# Patient Record
Sex: Female | Born: 1945 | Race: Black or African American | Hispanic: No | Marital: Single | Smoking: Former smoker
Health system: Southern US, Community
[De-identification: ages and names within clinical notes are randomized; demographics above are authoritative.]

## PROBLEM LIST (undated history)

## (undated) DIAGNOSIS — E119 Type 2 diabetes mellitus without complications: Secondary | ICD-10-CM

## (undated) DIAGNOSIS — I10 Essential (primary) hypertension: Secondary | ICD-10-CM

## (undated) DIAGNOSIS — I639 Cerebral infarction, unspecified: Secondary | ICD-10-CM

---

## 2016-01-11 DIAGNOSIS — K7030 Alcoholic cirrhosis of liver without ascites: Secondary | ICD-10-CM | POA: Diagnosis present

## 2016-01-11 DIAGNOSIS — I69359 Hemiplegia and hemiparesis following cerebral infarction affecting unspecified side: Secondary | ICD-10-CM

## 2016-10-24 DIAGNOSIS — Z8619 Personal history of other infectious and parasitic diseases: Secondary | ICD-10-CM | POA: Diagnosis present

## 2017-03-13 ENCOUNTER — Other Ambulatory Visit: Payer: Self-pay | Admitting: Family Medicine

## 2017-03-13 DIAGNOSIS — E119 Type 2 diabetes mellitus without complications: Secondary | ICD-10-CM

## 2017-03-13 DIAGNOSIS — I639 Cerebral infarction, unspecified: Secondary | ICD-10-CM

## 2017-03-13 DIAGNOSIS — R200 Anesthesia of skin: Secondary | ICD-10-CM

## 2017-03-14 ENCOUNTER — Ambulatory Visit: Payer: Self-pay

## 2017-04-29 DIAGNOSIS — N390 Urinary tract infection, site not specified: Secondary | ICD-10-CM | POA: Insufficient documentation

## 2018-09-14 DIAGNOSIS — I10 Essential (primary) hypertension: Secondary | ICD-10-CM | POA: Diagnosis present

## 2018-11-06 ENCOUNTER — Encounter: Payer: Self-pay | Admitting: Emergency Medicine

## 2018-11-06 ENCOUNTER — Other Ambulatory Visit: Payer: Self-pay

## 2018-11-06 DIAGNOSIS — E11649 Type 2 diabetes mellitus with hypoglycemia without coma: Secondary | ICD-10-CM | POA: Diagnosis present

## 2018-11-06 DIAGNOSIS — I10 Essential (primary) hypertension: Secondary | ICD-10-CM | POA: Insufficient documentation

## 2018-11-06 DIAGNOSIS — T383X1A Poisoning by insulin and oral hypoglycemic [antidiabetic] drugs, accidental (unintentional), initial encounter: Secondary | ICD-10-CM | POA: Insufficient documentation

## 2018-11-06 DIAGNOSIS — Z79899 Other long term (current) drug therapy: Secondary | ICD-10-CM | POA: Diagnosis not present

## 2018-11-06 DIAGNOSIS — Z7984 Long term (current) use of oral hypoglycemic drugs: Secondary | ICD-10-CM | POA: Diagnosis not present

## 2018-11-06 NOTE — ED Triage Notes (Signed)
Pt arrives accompanied with daughter, reports pt's sister administered night medication accidentally reports pt already took night medications, reports has been having n/v and CBG dropped.

## 2018-11-06 NOTE — ED Triage Notes (Signed)
Pt arrives accompanied by daughter, reports accidentaly tonight her sister admisnitered her night medications was not aware pt already took them glipizide-metformin, benadryl, olanpazine, vimpat..CBG at home 90 took glucose tablets prior to arrival to ER. Pt hx of stroke left side deficit.

## 2018-11-07 ENCOUNTER — Emergency Department
Admission: EM | Admit: 2018-11-07 | Discharge: 2018-11-07 | Disposition: A | Discharge status: Home / Self Care | Payer: 59 | Attending: Emergency Medicine | Admitting: Emergency Medicine

## 2018-11-07 DIAGNOSIS — T50901A Poisoning by unspecified drugs, medicaments and biological substances, accidental (unintentional), initial encounter: Secondary | ICD-10-CM

## 2018-11-07 DIAGNOSIS — T383X1A Poisoning by insulin and oral hypoglycemic [antidiabetic] drugs, accidental (unintentional), initial encounter: Secondary | ICD-10-CM | POA: Diagnosis not present

## 2018-11-07 HISTORY — DX: Essential (primary) hypertension: I10

## 2018-11-07 HISTORY — DX: Type 2 diabetes mellitus without complications: E11.9

## 2018-11-07 HISTORY — DX: Cerebral infarction, unspecified: I63.9

## 2018-11-07 LAB — CBC WITH DIFFERENTIAL/PLATELET
Abs Immature Granulocytes: 0.02 10*3/uL (ref 0.00–0.07)
Basophils Absolute: 0.1 10*3/uL (ref 0.0–0.1)
Basophils Relative: 1 %
Eosinophils Absolute: 0.2 10*3/uL (ref 0.0–0.5)
Eosinophils Relative: 2 %
HCT: 41.5 % (ref 36.0–46.0)
Hemoglobin: 13.6 g/dL (ref 12.0–15.0)
Immature Granulocytes: 0 %
Lymphocytes Relative: 49 %
Lymphs Abs: 4.6 10*3/uL — ABNORMAL HIGH (ref 0.7–4.0)
MCH: 30.4 pg (ref 26.0–34.0)
MCHC: 32.8 g/dL (ref 30.0–36.0)
MCV: 92.8 fL (ref 80.0–100.0)
Monocytes Absolute: 0.5 10*3/uL (ref 0.1–1.0)
Monocytes Relative: 5 %
Neutro Abs: 4 10*3/uL (ref 1.7–7.7)
Neutrophils Relative %: 43 %
Platelets: 193 10*3/uL (ref 150–400)
RBC: 4.47 MIL/uL (ref 3.87–5.11)
RDW: 13.2 % (ref 11.5–15.5)
WBC: 9.4 10*3/uL (ref 4.0–10.5)
nRBC: 0 % (ref 0.0–0.2)

## 2018-11-07 LAB — GLUCOSE, CAPILLARY
Glucose-Capillary: 102 mg/dL — ABNORMAL HIGH (ref 70–99)
Glucose-Capillary: 119 mg/dL — ABNORMAL HIGH (ref 70–99)
Glucose-Capillary: 161 mg/dL — ABNORMAL HIGH (ref 70–99)

## 2018-11-07 LAB — COMPREHENSIVE METABOLIC PANEL
ALT: 38 U/L (ref 0–44)
AST: 34 U/L (ref 15–41)
Albumin: 4.4 g/dL (ref 3.5–5.0)
Alkaline Phosphatase: 43 U/L (ref 38–126)
Anion gap: 11 (ref 5–15)
BUN: 10 mg/dL (ref 8–23)
CO2: 27 mmol/L (ref 22–32)
Calcium: 8.9 mg/dL (ref 8.9–10.3)
Chloride: 104 mmol/L (ref 98–111)
Creatinine, Ser: 0.7 mg/dL (ref 0.44–1.00)
GFR calc Af Amer: >60 mL/min (ref >60)
GFR calc non Af Amer: >60 mL/min (ref >60)
Glucose, Bld: 113 mg/dL — ABNORMAL HIGH (ref 70–99)
Potassium: 3.7 mmol/L (ref 3.5–5.1)
Sodium: 142 mmol/L (ref 135–145)
Total Bilirubin: 0.7 mg/dL (ref 0.3–1.2)
Total Protein: 7.4 g/dL (ref 6.5–8.1)

## 2018-11-07 MED ORDER — DEXTROSE IN LACTATED RINGERS 5 % IV SOLN
1000.0000 mL | Freq: Once | INTRAVENOUS | Status: AC
  Administered 2018-11-07: 1000 mL via INTRAVENOUS

## 2018-11-07 NOTE — ED Notes (Signed)
Pt provided more apple juice at this time.

## 2018-11-07 NOTE — ED Notes (Addendum)
Poison control [phone] notified about patient. Poison control recommends 24 hour CBG and chemistry.

## 2018-11-07 NOTE — ED Notes (Signed)
IV attempted x 2 by this RN. 

## 2018-11-07 NOTE — ED Provider Notes (Signed)
[facility] Emergency Department Provider Note       Time seen: ----------------------------------------- 12:03 AM on 11/07/2018 -----------------------------------------   I have reviewed the triage vital signs and the nursing notes.  HISTORY   Chief Complaint Dizziness and Hypoglycemia   HPI Carmen Olson is a 73 y.o. female with a history of diabetes, hypertension, CVA who presents to the ED for a possible overdose or accidental overdose.  Patient arrives accompanied by her daughter.  Her sister administered her nightly medications and was not aware she had already taken them.  She took glipizide, metformin, Benadryl, olanzapine and Vimpat.  CBG at home was 90.  She took glucose tablets prior to arrival.  She has a previous history of stroke with left-sided deficits.  She did have an episode of vomiting after she took the medication.  She denies any complaints at this time.  Past Medical History:  Diagnosis Date  . Diabetes mellitus without complication (HCC)   . Hypertension   . Stroke [facility](HCC)     There are no active problems to display for this patient.   History reviewed. No pertinent surgical history.  Allergies Sulfa antibiotics  Social History Social History   Tobacco Use  . Smoking status: Not on file  Substance Use Topics  . Alcohol use: Not on file  . Drug use: Not on file   Review of Systems Constitutional: Negative for fever. Cardiovascular: Negative for chest pain. Respiratory: Negative for shortness of breath. Gastrointestinal: Negative for abdominal pain, positive for vomiting Musculoskeletal: Negative for back pain. Skin: Negative for rash. Neurological: Negative for headaches, focal weakness or numbness.  All systems negative/normal/unremarkable except as stated in the HPI  ____________________________________________   PHYSICAL EXAM:  VITAL SIGNS: ED Triage Vitals  Enc Vitals Group     BP 11/06/18 2352 (!) 162/92      Pulse Rate 11/06/18 2352 87     Resp 11/06/18 2352 16     Temp 11/06/18 2352 98.7 F (37.1 C)     Temp Source 11/06/18 2352 Oral     SpO2 11/06/18 2352 95 %     Weight 11/06/18 2353 190 lb (86.2 kg)     Height 11/06/18 2353 5' (1.524 m)     Head Circumference --      Peak Flow --      Pain Score --      Pain Loc --      Pain Edu? --      Excl. in GC? --     Constitutional: Well appearing and in no distress. Eyes: Conjunctivae are normal. Normal extraocular movements. ENT      Head: Normocephalic and atraumatic.      Nose: No congestion/rhinnorhea.      Mouth/Throat: Mucous membranes are moist.      Neck: No stridor. Cardiovascular: Normal rate, regular rhythm. No murmurs, rubs, or gallops. Respiratory: Normal respiratory effort without tachypnea nor retractions. Breath sounds are clear and equal bilaterally. No wheezes/rales/rhonchi. Gastrointestinal: Soft and nontender. Normal bowel sounds Musculoskeletal: Nontender with normal range of motion in extremities. No lower extremity tenderness nor edema. Neurologic:  Normal speech and language. No gross focal neurologic deficits are appreciated.  Skin:  Skin is warm, dry and intact. No rash noted. Psychiatric: Mood and affect are normal. Speech and behavior are normal.  ____________________________________________  ED COURSE:  As part of my medical decision making, I reviewed the following data within the electronic MEDICAL RECORD NUMBER History obtained from family if available, nursing  notes, old chart and ekg, as well as notes from prior ED visits. Patient presented for accidental overdose, we will assess with labs as indicated at this time.   Procedures  Maansi Wike was evaluated in Emergency Department on 11/07/2018 for the symptoms described in the history of present illness. She was evaluated in the context of the global COVID-19 pandemic, which necessitated consideration that the patient might be at risk for infection with  the SARS-CoV-2 virus that causes COVID-19. Institutional protocols and algorithms that pertain to the evaluation of patients at risk for COVID-19 are in a state of rapid change based on information released by regulatory bodies including the CDC and federal and state organizations. These policies and algorithms were followed during the patient's care in the ED.  ____________________________________________   LABS (pertinent positives/negatives)  Labs Reviewed  GLUCOSE, CAPILLARY - Abnormal; Notable for the following components:      Result Value   Glucose-Capillary 102 (*)    All other components within normal limits  CBC WITH DIFFERENTIAL/PLATELET - Abnormal; Notable for the following components:   Lymphs Abs 4.6 (*)    All other components within normal limits  COMPREHENSIVE METABOLIC PANEL - Abnormal; Notable for the following components:   Glucose, Bld 113 (*)    All other components within normal limits  GLUCOSE, CAPILLARY - Abnormal; Notable for the following components:   Glucose-Capillary 161 (*)    All other components within normal limits  URINALYSIS, COMPLETE (UACMP) WITH MICROSCOPIC  CBG MONITORING, ED  CBG MONITORING, ED  ____________________________________________   DIFFERENTIAL DIAGNOSIS   Accidental overdose, hypoglycemia, dehydration, electrolyte abnormality  FINAL ASSESSMENT AND PLAN  Accidental overdose   Plan: The patient had presented for an accidental ingestion of her medications for tomorrow. Patient's labs have not resulted in any hypoglycemia, likely indicating she vomited up some of her medicine.  At any rate she did not ingest a significant amount of medication and appears cleared for outpatient follow-up.  I will advise holding her hypoglycemic agents for 24 hours.   Laurence Aly, MD    Note: This note was generated in part or whole with voice recognition software. Voice recognition is usually quite accurate but there are transcription  errors that can and very often do occur. I apologize for any typographical errors that were not detected and corrected.     Earleen Newport, MD 11/07/18 [phone]

## 2018-11-07 NOTE — ED Notes (Signed)
MD Jimmye Norman aware of poison control's recommendations.

## 2019-03-18 ENCOUNTER — Emergency Department: Payer: 59

## 2019-03-18 ENCOUNTER — Inpatient Hospital Stay
Admission: EM | Admit: 2019-03-18 | Discharge: 2019-03-25 | DRG: 177 | Disposition: A | Discharge status: Home Health | Payer: 59 | Attending: Family Medicine | Admitting: Family Medicine

## 2019-03-18 ENCOUNTER — Other Ambulatory Visit: Payer: Self-pay

## 2019-03-18 DIAGNOSIS — Z7982 Long term (current) use of aspirin: Secondary | ICD-10-CM | POA: Diagnosis not present

## 2019-03-18 DIAGNOSIS — R0902 Hypoxemia: Secondary | ICD-10-CM | POA: Diagnosis not present

## 2019-03-18 DIAGNOSIS — M109 Gout, unspecified: Secondary | ICD-10-CM | POA: Diagnosis present

## 2019-03-18 DIAGNOSIS — R0602 Shortness of breath: Secondary | ICD-10-CM | POA: Diagnosis present

## 2019-03-18 DIAGNOSIS — R531 Weakness: Secondary | ICD-10-CM

## 2019-03-18 DIAGNOSIS — J9601 Acute respiratory failure with hypoxia: Secondary | ICD-10-CM | POA: Diagnosis present

## 2019-03-18 DIAGNOSIS — T380X5A Adverse effect of glucocorticoids and synthetic analogues, initial encounter: Secondary | ICD-10-CM | POA: Diagnosis not present

## 2019-03-18 DIAGNOSIS — E669 Obesity, unspecified: Secondary | ICD-10-CM | POA: Diagnosis present

## 2019-03-18 DIAGNOSIS — L89152 Pressure ulcer of sacral region, stage 2: Secondary | ICD-10-CM | POA: Diagnosis present

## 2019-03-18 DIAGNOSIS — I69354 Hemiplegia and hemiparesis following cerebral infarction affecting left non-dominant side: Secondary | ICD-10-CM

## 2019-03-18 DIAGNOSIS — U071 COVID-19: Principal | ICD-10-CM

## 2019-03-18 DIAGNOSIS — Z8619 Personal history of other infectious and parasitic diseases: Secondary | ICD-10-CM

## 2019-03-18 DIAGNOSIS — J1289 Other viral pneumonia: Secondary | ICD-10-CM | POA: Diagnosis present

## 2019-03-18 DIAGNOSIS — E1165 Type 2 diabetes mellitus with hyperglycemia: Secondary | ICD-10-CM | POA: Diagnosis not present

## 2019-03-18 DIAGNOSIS — L8990 Pressure ulcer of unspecified site, unspecified stage: Secondary | ICD-10-CM | POA: Insufficient documentation

## 2019-03-18 DIAGNOSIS — Z882 Allergy status to sulfonamides status: Secondary | ICD-10-CM

## 2019-03-18 DIAGNOSIS — I10 Essential (primary) hypertension: Secondary | ICD-10-CM | POA: Diagnosis present

## 2019-03-18 DIAGNOSIS — G9341 Metabolic encephalopathy: Secondary | ICD-10-CM | POA: Diagnosis not present

## 2019-03-18 DIAGNOSIS — Z7902 Long term (current) use of antithrombotics/antiplatelets: Secondary | ICD-10-CM | POA: Diagnosis not present

## 2019-03-18 DIAGNOSIS — Z6832 Body mass index (BMI) 32.0-32.9, adult: Secondary | ICD-10-CM

## 2019-03-18 DIAGNOSIS — K7030 Alcoholic cirrhosis of liver without ascites: Secondary | ICD-10-CM | POA: Diagnosis present

## 2019-03-18 DIAGNOSIS — I69359 Hemiplegia and hemiparesis following cerebral infarction affecting unspecified side: Secondary | ICD-10-CM

## 2019-03-18 DIAGNOSIS — E119 Type 2 diabetes mellitus without complications: Secondary | ICD-10-CM

## 2019-03-18 LAB — CBC WITH DIFFERENTIAL/PLATELET
Abs Immature Granulocytes: 0.06 10*3/uL (ref 0.00–0.07)
Basophils Absolute: 0 10*3/uL (ref 0.0–0.1)
Basophils Relative: 0 %
Eosinophils Absolute: 0 10*3/uL (ref 0.0–0.5)
Eosinophils Relative: 0 %
HCT: 39.9 % (ref 36.0–46.0)
Hemoglobin: 12.9 g/dL (ref 12.0–15.0)
Immature Granulocytes: 1 %
Lymphocytes Relative: 35 %
Lymphs Abs: 4 10*3/uL (ref 0.7–4.0)
MCH: 29.9 pg (ref 26.0–34.0)
MCHC: 32.3 g/dL (ref 30.0–36.0)
MCV: 92.4 fL (ref 80.0–100.0)
Monocytes Absolute: 0.8 10*3/uL (ref 0.1–1.0)
Monocytes Relative: 7 %
Neutro Abs: 6.6 10*3/uL (ref 1.7–7.7)
Neutrophils Relative %: 57 %
Platelets: 187 10*3/uL (ref 150–400)
RBC: 4.32 MIL/uL (ref 3.87–5.11)
RDW: 13 % (ref 11.5–15.5)
WBC: 11.5 10*3/uL — ABNORMAL HIGH (ref 4.0–10.5)
nRBC: 0 % (ref 0.0–0.2)

## 2019-03-18 LAB — CBC
HCT: 38.5 % (ref 36.0–46.0)
Hemoglobin: 13.1 g/dL (ref 12.0–15.0)
MCH: 30.1 pg (ref 26.0–34.0)
MCHC: 34 g/dL (ref 30.0–36.0)
MCV: 88.5 fL (ref 80.0–100.0)
Platelets: 175 10*3/uL (ref 150–400)
RBC: 4.35 MIL/uL (ref 3.87–5.11)
RDW: 12.7 % (ref 11.5–15.5)
WBC: 11.6 10*3/uL — ABNORMAL HIGH (ref 4.0–10.5)
nRBC: 0 % (ref 0.0–0.2)

## 2019-03-18 LAB — BASIC METABOLIC PANEL
Anion gap: 12 (ref 5–15)
BUN: 17 mg/dL (ref 8–23)
CO2: 25 mmol/L (ref 22–32)
Calcium: 8.5 mg/dL — ABNORMAL LOW (ref 8.9–10.3)
Chloride: 101 mmol/L (ref 98–111)
Creatinine, Ser: 0.87 mg/dL (ref 0.44–1.00)
GFR calc Af Amer: >60 mL/min (ref >60)
GFR calc non Af Amer: >60 mL/min (ref >60)
Glucose, Bld: 160 mg/dL — ABNORMAL HIGH (ref 70–99)
Potassium: 3.5 mmol/L (ref 3.5–5.1)
Sodium: 138 mmol/L (ref 135–145)

## 2019-03-18 LAB — GLUCOSE, CAPILLARY: Glucose-Capillary: 171 mg/dL — ABNORMAL HIGH (ref 70–99)

## 2019-03-18 LAB — HEPATIC FUNCTION PANEL
ALT: 71 U/L — ABNORMAL HIGH (ref 0–44)
AST: 87 U/L — ABNORMAL HIGH (ref 15–41)
Albumin: 3.5 g/dL (ref 3.5–5.0)
Alkaline Phosphatase: 28 U/L — ABNORMAL LOW (ref 38–126)
Bilirubin, Direct: 0.2 mg/dL (ref 0.0–0.2)
Indirect Bilirubin: 0.5 mg/dL (ref 0.3–0.9)
Total Bilirubin: 0.7 mg/dL (ref 0.3–1.2)
Total Protein: 7.4 g/dL (ref 6.5–8.1)

## 2019-03-18 LAB — PROCALCITONIN: Procalcitonin: <0.1 ng/mL

## 2019-03-18 LAB — SEDIMENTATION RATE: Sed Rate: 67 mm/hr — ABNORMAL HIGH (ref 0–30)

## 2019-03-18 NOTE — ED Notes (Signed)
O2 tank changed to full tank 

## 2019-03-18 NOTE — ED Notes (Signed)
O2 removed per MD request. Will assess how pt tolerates room air. Pt currently has no complaints of pain or shortness of breath.

## 2019-03-18 NOTE — ED Triage Notes (Signed)
First RN Note: Pt presents to ED via [facility] from home with c/o SOB, fever, per EMS pt tested covid+ several days ago, EMS reports pt was 92% on RA, 96% on 4L upon arrival to ED. EMS reports family reports that patient had fever of 101, gave Tylenol and temp went down to 100. EMS reports a-febrile en route.   96% 4L 118/60 EtCO2 44 RR 40 88NSR

## 2019-03-18 NOTE — ED Notes (Signed)
Report given to Sarah RN

## 2019-03-18 NOTE — H&P (Signed)
History and Physical        Hospital Admission Note Date: 03/19/2019  Patient name: Carmen Olson Medical record number: [id number] Date of birth: 05-25-1945 Age: 73 y.o. Gender: female  PCP: Nathanial Millman, PA    Patient coming from: Home   I have reviewed all records in the [facility].    Chief Complaint:  Shortness of Breath   HPI: Carmen Olson is a 73 y.o. female with PMH of CVA with left hemiparesis, HTN, T2DM, liver cirrhosis and history of Hep C who presents for shortness of breath and fever. Patient tested positive for COVID last week. Now feeling more symptomatic.    ED work-up/course:  CXR with concern for multifocal pneumonia. Mild leukocytosis. Pro-calcitonin negative. Patient desats to 43 with movement, requiring 2L O2 to maintain saturations.    Review of Systems: Positives marked in 'bold' Constitutional: Denies fever, chills, diaphoresis, poor appetite and fatigue.  HEENT: Denies photophobia, eye pain, redness, hearing loss, ear pain, congestion, sore throat, rhinorrhea, sneezing, mouth sores, trouble swallowing, neck pain, neck stiffness and tinnitus.   Respiratory: Denies SOB, DOE, cough, chest tightness,  and wheezing.   Cardiovascular: Denies chest pain, palpitations and leg swelling.  Gastrointestinal: Denies nausea, vomiting, abdominal pain, diarrhea, constipation, blood in stool and abdominal distention.  Genitourinary: Denies dysuria, urgency, frequency, hematuria, flank pain and difficulty urinating.  Musculoskeletal: Denies myalgias, back pain, joint swelling, arthralgias and gait problem.  Skin: Denies pallor, rash and wound.  Neurological: Denies dizziness, seizures, syncope, weakness, light-headedness, numbness and headaches.  Hematological: Denies adenopathy. Easy bruising, personal or family bleeding history  Psychiatric/Behavioral: Denies suicidal ideation, mood changes,  confusion, nervousness, sleep disturbance and agitation  Past Medical History: Past Medical History:  Diagnosis Date  . Diabetes mellitus without complication ([place])   . Hypertension   . Stroke [facility])     History reviewed. No pertinent surgical history.  Medications: Prior to Admission medications   Medication Sig Start Date End Date Taking? Authorizing Provider  amLODipine (NORVASC) 5 MG tablet Take 5 mg by mouth daily. 01/30/19   [provider]  ascorbic acid (VITAMIN C) 500 MG tablet Take 500 mg by mouth daily.    [provider]  aspirin 81 MG EC tablet Take 81 mg by mouth daily.    [provider]  Cholecalciferol 50 MCG (2000 UT) TABS Take 2,000 Units by mouth daily.    [provider]  Cinnamon 500 MG capsule Take 500 mg by mouth 2 (two) times daily.    [provider]  clopidogrel (PLAVIX) 75 MG tablet Take 75 mg by mouth daily. 02/15/19   [provider]  colchicine 0.6 MG tablet Take 0.6 mg by mouth daily. 01/30/19   [provider]  enalapril (VASOTEC) 10 MG tablet Take 10 mg by mouth daily. 01/30/19   [provider]  glipiZIDE-metformin (METAGLIP) 2.5-500 MG tablet Take 2 tablets by mouth 2 (two) times daily. 03/01/19   [provider]  rosuvastatin (CRESTOR) 5 MG tablet Take 5 mg by mouth daily. 01/28/19   [provider]  VIMPAT 50 MG TABS tablet Take 50 mg by mouth 2 (two) times daily. 01/30/19  [provider]    Allergies:   Allergies  Allergen Reactions  . Sulfa Antibiotics     Social History:  has no history on file for tobacco, alcohol, and drug.  Family History: No family history on file.  Physical Exam: Blood pressure 135/70, pulse 90, temperature 98.9 F (37.2 C), temperature source Oral, resp. rate 20, height 5' (1.524 m), weight 90.7 kg, SpO2 96 %. General: Alert, awake, oriented x3, in no acute distress. Eyes: pink conjunctiva,anicteric sclera,  pupils equal and reactive to light and accomodation, HEENT: normocephalic, atraumatic, oropharynx clear Neck: supple, no masses or lymphadenopathy, no goiter, no bruits, no JVD CVS: Regular rate and rhythm, without murmurs, rubs or gallops. No lower extremity edema Resp : Clear to auscultation bilaterally, no wheezing, rales or rhonchi. GI : Soft, nontender, nondistended, positive bowel sounds, no masses. No hepatomegaly. No hernia.  Musculoskeletal: No clubbing or cyanosis, positive pedal pulses. No contracture. ROM intact  Neuro: Grossly intact, no focal neurological deficits, left strength diminished compared to right due to prior CVA  Psych: alert and oriented x 3, normal mood and affect Skin: no rashes or lesions, warm and dry   LABS on Admission: I have personally reviewed all the labs and imagings below    Basic Metabolic Panel: Recent Labs  Lab 03/18/19 1435  NA 138  K 3.5  CL 101  CO2 25  GLUCOSE 160*  BUN 17  CREATININE 0.87  CALCIUM 8.5*   Liver Function Tests: Recent Labs  Lab 03/18/19 1459  AST 87*  ALT 71*  ALKPHOS 28*  BILITOT 0.7  PROT 7.4  ALBUMIN 3.5   No results for input(s): LIPASE, AMYLASE in the last 168 hours. No results for input(s): AMMONIA in the last 168 hours. CBC: Recent Labs  Lab 03/18/19 1435  WBC 11.5*  11.6*  NEUTROABS 6.6  HGB 12.9  13.1  HCT 39.9  38.5  MCV 92.4  88.5  PLT 187  175   Cardiac Enzymes: No results for input(s): CKTOTAL, CKMB, CKMBINDEX, TROPONINI in the last 168 hours. BNP: Invalid input(s): POCBNP CBG: Recent Labs  Lab 03/18/19 1944  GLUCAP 171*    Radiological Exams on Admission:  DG Chest 2 View  Result Date: 03/18/2019 CLINICAL DATA:  Shortness of breath, COVID-19 positive EXAM: CHEST - 2 VIEW COMPARISON:  None. FINDINGS: Airspace opacity present in the left mid to lower lung as well as more pain streaky opacity in the right infrahilar lung. No pneumothorax or effusion. The cardiomediastinal  contours are unremarkable. No acute osseous or soft tissue abnormality. Degenerative changes are present in the imaged spine and shoulders. IMPRESSION: Features worrisome for multifocal pneumonia, particularly in the setting of COVID-19 positivity. Electronically Signed   By: Lovena Le M.D.   On: 03/18/2019 15:11      EKG: Independently reviewed. NSR without acute changes.    Assessment/Plan Active Problems:   Alcoholic cirrhosis of liver without ascites (HCC)   CVA, old, hemiparesis ([place])   Essential hypertension   Hx of hepatitis C   Type 2 diabetes mellitus without complication, without long-term current use of insulin ([place])   COVID-19   Hypoxia   COVID-19 with Hypoxia  Patient with desaturation to 88 with ambulation. Maintaining O2 saturations on 2L [place]. CXR concerning for multifocal pneumonia particularly in setting of COVID+ result. PCT negative. ESR 67. Leukocytosis 11.6. AST/ALT 87/71. Will not treat as bacterial pneumonia at present given negative procalcitonin and only minimally elevated WBC.  -admit to SDU, telemetry  with continuous pulse ox  -start Decadron  -remdesivir per pharmacy (LFTs within range for starting medication)  -CRP pending  -obtain inflammatory markers:  D-dimer, LDH  -follow CRP daily  -repeat CBC and CMET in AM  -low threshold to add antibiotics to treat for bacterial pneumonia if patient worsens   T2DM  CBG 170 at admit.  -hold metformin/glipizide given acute illness  -CBGs 4x daily  -add SSI if glucose >200, will likely occur with steroid use   HTN   Normotensive at admission.  -continue Enalapril and Amlodipine   History of CVA  Residual left hemiparesis.  -continue Plavix, crestor and ASA   Cirrhosis with H/o Hep C  AST/ALT 87/71 -repeat CMET in AM    DVT prophylaxis: Lovenox   CODE STATUS: FULL   Consults called: None   Family Communication: Admission, patients condition and plan of care including tests being ordered have  been discussed with the patient who indicates understanding and agree with the plan and Code Status  Admission status: Inpatient   The medical decision making on this patient was of high complexity and the patient is at high risk for clinical deterioration, therefore this is a level 3 admission.  Severity of Illness:     Moderate  The appropriate patient status for this patient is INPATIENT. Inpatient status is judged to be reasonable and necessary in order to provide the required intensity of service to ensure the patient's safety. The patient's presenting symptoms, physical exam findings, and initial radiographic and laboratory data in the context of their chronic comorbidities is felt to place them at high risk for further clinical deterioration. Furthermore, it is not anticipated that the patient will be medically stable for discharge from the hospital within 2 midnights of admission. The following factors support the patient status of inpatient.   " The patient's presenting symptoms include SOB with recent COVID+ test. " The worrisome physical exam findings include hypoxia. " The initial radiographic and laboratory data are worrisome because of CXR with multifocal PNA, mild leukocytosis. " The chronic co-morbidities include cirrhosis, T2DM, HTN, h/o Hep C, h/o CVA.   * I certify that at the point of admission it is my clinical judgment that the patient will require inpatient hospital care spanning beyond 2 midnights from the point of admission due to high intensity of service, high risk for further deterioration and high frequency of surveillance required.*    Time Spent on Admission: 55 minutes      Melina Schools D.O.  [facility] 03/19/2019, 12:07 AM

## 2019-03-18 NOTE — ED Triage Notes (Signed)
History limited due to patient being poor historian, limited answers to questions. Pt here due to COVID and SOB. Pt in NAD at this time. RR even, unlabored with sitting. Able to speak in short phrases without difficulty.

## 2019-03-18 NOTE — ED Notes (Addendum)
Pt oxygen's O2 saturation dropped to 88% on RA during the transfer back from the restroom on the 2nd attempt. Pt had a notable increase in her WOB and required a 2 person assist back to bed. MD notified

## 2019-03-18 NOTE — ED Notes (Signed)
Pt O2 tank 1/4 full - pt in no acute distress at this time - family called and reports that she is covid + and DM - they request CBG be done d/t hx of hypoglycemia

## 2019-03-18 NOTE — ED Notes (Signed)
Placed on 2L [place]  

## 2019-03-18 NOTE — ED Notes (Signed)
Carmen Olson (niece) [phone] Pt exposed to covid November 27th (2 family members) - Pt tested positive Dec 3rd with [facility] in [place]

## 2019-03-18 NOTE — ED Notes (Signed)
20H move was error in cursor position

## 2019-03-18 NOTE — ED Notes (Addendum)
Pt assisted with ambulating inexam room. 2 person assist required. Pt dyspneic and weak with excursion. Pt taken to bedside commode but was unable to void at this time. O2 saturation 91% on room air once pt assisted back to stretcher. Provided for safety and comfort and will continue to assess. Dr. Cinda Quest aware. Pt placed back on 2L O2 via nasal canula.

## 2019-03-18 NOTE — ED Notes (Addendum)
VS obtained by this RN, this RN verified O2 tank with approx 1/2 tank left. Pt provided with warm blankets at this time.

## 2019-03-18 NOTE — ED Notes (Signed)
Unsuccessfully attempted to place IV for admission and ordered labs. MD and charge nurse aware.

## 2019-03-18 NOTE — ED Provider Notes (Signed)
[facility] Emergency Department Provider Note   ____________________________________________   First MD Initiated Contact with Patient 03/18/19 2011     (approximate)  I have reviewed the triage vital signs and the nursing notes.   HISTORY  Chief Complaint Shortness of Breath    HPI Shiryl Ruddy is a 73 y.o. female comes in complaining of shortness of breath and fever.  She tested positive for Covid Thursday last week as I understand.  O2 sats resting about her 90%.  On 2 L and up to 92 on 4 L 94         Past Medical History:  Diagnosis Date  . Diabetes mellitus without complication ([place])   . Hypertension   . Stroke [facility])     There are no problems to display for this patient.   History reviewed. No pertinent surgical history.  Prior to Admission medications   Medication Sig Start Date End Date Taking? Authorizing Provider  amLODipine (NORVASC) 5 MG tablet Take 5 mg by mouth daily. 01/30/19   [provider]  clopidogrel (PLAVIX) 75 MG tablet Take 75 mg by mouth daily. 02/15/19   [provider]  colchicine 0.6 MG tablet Take 0.6 mg by mouth daily. 01/30/19   [provider]  enalapril (VASOTEC) 10 MG tablet Take 10 mg by mouth daily. 01/30/19   [provider]  glipiZIDE-metformin (METAGLIP) 2.5-500 MG tablet Take 2 tablets by mouth 2 (two) times daily. 03/01/19   [provider]  rosuvastatin (CRESTOR) 5 MG tablet Take 5 mg by mouth daily. 01/28/19   [provider]  VIMPAT 50 MG TABS tablet Take 50 mg by mouth 2 (two) times daily. 01/30/19   [provider]    Allergies Sulfa antibiotics  No family history on file.  Social History Social History   Tobacco Use  . Smoking status: Not on file  Substance Use Topics  . Alcohol use: Not on file  . Drug use: Not on file    Review of Systems  Constitutional: No fever/chills Eyes: No visual changes. ENT: No sore  throat. Cardiovascular: Denies chest pain. Respiratoryshortness of breath. Gastrointestinal: No abdominal pain.  No nausea, no vomiting.  No diarrhea.  No constipation. Genitourinary: Negative for dysuria. Musculoskeletal: Negative for back pain. Skin: Negative for rash. Neurological: Negative for headaches, new focal weakness   ____________________________________________   PHYSICAL EXAM:  VITAL SIGNS: ED Triage Vitals  Enc Vitals Group     BP 03/18/19 1432 116/64     Pulse Rate 03/18/19 1432 87     Resp 03/18/19 1432 (!) 26     Temp 03/18/19 1432 98.9 F (37.2 C)     Temp Source 03/18/19 1432 Oral     SpO2 03/18/19 1432 93 %     Weight 03/18/19 1433 200 lb (90.7 kg)     Height 03/18/19 1433 5' (1.524 m)     Head Circumference --      Peak Flow --      Pain Score 03/18/19 1433 0     Pain Loc --      Pain Edu? --      Excl. in [place]? --     Constitutional: Alert and oriented to person and hospital. Well appearing and in no acute distress. Eyes: Conjunctivae are normal. P Head: Atraumatic. Nose: No congestion/rhinnorhea. Mouth/Throat: Mucous membranes are moist.  Oropharynx non-erythematous. Neck: No stridor.   Cardiovascular: Normal rate, regular rhythm. Grossly normal heart sounds.  Good peripheral  circulation. Respiratory: Normal respiratory effort.  No retractions. Lungs CTAB. Gastrointestinal: Soft and nontender. No distention. No abdominal bruits. No CVA tenderness. Musculoskeletal: No lower extremity tenderness nor edema. . Neurologic:  Normal speech and language. No new gross focal neurologic deficits are appreciated.  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 No rash noted.   ____________________________________________   LABS (all labs ordered are listed, but only abnormal results are displayed)  Labs Reviewed  BASIC METABOLIC PANEL - Abnormal; Notable for the following components:      Result  Value   Glucose, Bld 160 (*)    Calcium 8.5 (*)    All other components within normal limits  CBC - Abnormal; Notable for the following components:   WBC 11.6 (*)    All other components within normal limits  GLUCOSE, CAPILLARY - Abnormal; Notable for the following components:   Glucose-Capillary 171 (*)    All other components within normal limits  HEPATIC FUNCTION PANEL - Abnormal; Notable for the following components:   AST 87 (*)    ALT 71 (*)    Alkaline Phosphatase 28 (*)    All other components within normal limits  CBC WITH DIFFERENTIAL/PLATELET - Abnormal; Notable for the following components:   WBC 11.5 (*)    All other components within normal limits  PROCALCITONIN  SEDIMENTATION RATE  C-REACTIVE PROTEIN  CBG MONITORING, ED   ____________________________________________  EKG   ____________________________________________  RADIOLOGY  ED MD interpretation: Chest x-ray read by radiology reviewed by me shows what appears to be multifocal pneumonia possibly with Covid.  Official radiology report(s): DG Chest 2 View  Result Date: 03/18/2019 CLINICAL DATA:  Shortness of breath, COVID-19 positive EXAM: CHEST - 2 VIEW COMPARISON:  None. FINDINGS: Airspace opacity present in the left mid to lower lung as well as more pain streaky opacity in the right infrahilar lung. No pneumothorax or effusion. The cardiomediastinal contours are unremarkable. No acute osseous or soft tissue abnormality. Degenerative changes are present in the imaged spine and shoulders. IMPRESSION: Features worrisome for multifocal pneumonia, particularly in the setting of COVID-19 positivity. Electronically Signed   By: Kreg Shropshire M.D.   On: 03/18/2019 15:11    ____________________________________________   PROCEDURES  Procedure(s) performed (including Critical Care):  Procedures   ____________________________________________   INITIAL IMPRESSION / ASSESSMENT AND PLAN / ED COURSE  Patient  with a somewhat elevated white blood count.  We will get a differential and procalcitonin to see if this could be something besides a Covid pneumonia.     ----------------------------------------- 10:50 PM on 03/18/2019 -----------------------------------------  Patient remains very weak really unable to walk without a lot of help.  Additionally she desats to 88 while trying to walk.  She is having some increased work of breathing as well.  Looks like we will have to get this lady in the hospital.        ____________________________________________   FINAL CLINICAL IMPRESSION(S) / ED DIAGNOSES  Final diagnoses:  Hypoxia  Weakness  COVID-19     ED Discharge Orders    None       Note:  This document was prepared using Dragon voice recognition software and may include unintentional dictation errors.    Arnaldo Natal, MD 03/18/19 2250

## 2019-03-19 ENCOUNTER — Encounter: Payer: Self-pay | Admitting: Internal Medicine

## 2019-03-19 ENCOUNTER — Telehealth: Payer: Self-pay

## 2019-03-19 ENCOUNTER — Inpatient Hospital Stay: Payer: 59

## 2019-03-19 ENCOUNTER — Inpatient Hospital Stay (HOSPITAL_COMMUNITY): Admission: AD | Admit: 2019-03-19 | Payer: 59 | Source: Other Acute Inpatient Hospital | Admitting: Internal Medicine

## 2019-03-19 DIAGNOSIS — L8990 Pressure ulcer of unspecified site, unspecified stage: Secondary | ICD-10-CM | POA: Insufficient documentation

## 2019-03-19 LAB — GLUCOSE, CAPILLARY
Glucose-Capillary: 130 mg/dL — ABNORMAL HIGH (ref 70–99)
Glucose-Capillary: 235 mg/dL — ABNORMAL HIGH (ref 70–99)
Glucose-Capillary: 270 mg/dL — ABNORMAL HIGH (ref 70–99)
Glucose-Capillary: 261 mg/dL — ABNORMAL HIGH (ref 70–99)
Glucose-Capillary: 263 mg/dL — ABNORMAL HIGH (ref 70–99)

## 2019-03-19 LAB — BLOOD GAS, ARTERIAL
Acid-Base Excess: 5.2 mmol/L — ABNORMAL HIGH (ref 0.0–2.0)
Bicarbonate: 29.9 mmol/L — ABNORMAL HIGH (ref 20.0–28.0)
FIO2: 0.32
O2 Saturation: 95.8 %
Patient temperature: 37
pCO2 arterial: 43 mmHg (ref 32.0–48.0)
pH, Arterial: 7.45 (ref 7.350–7.450)
pO2, Arterial: 77 mmHg — ABNORMAL LOW (ref 83.0–108.0)

## 2019-03-19 LAB — CBC
HCT: 36.7 % (ref 36.0–46.0)
Hemoglobin: 12.4 g/dL (ref 12.0–15.0)
MCH: 30.1 pg (ref 26.0–34.0)
MCHC: 33.8 g/dL (ref 30.0–36.0)
MCV: 89.1 fL (ref 80.0–100.0)
Platelets: 175 10*3/uL (ref 150–400)
RBC: 4.12 MIL/uL (ref 3.87–5.11)
RDW: 13.1 % (ref 11.5–15.5)
WBC: 10.4 10*3/uL (ref 4.0–10.5)
nRBC: 0 % (ref 0.0–0.2)

## 2019-03-19 LAB — LACTATE DEHYDROGENASE: LDH: 324 U/L — ABNORMAL HIGH (ref 98–192)

## 2019-03-19 LAB — POC SARS CORONAVIRUS 2 AG: SARS Coronavirus 2 Ag: NEGATIVE

## 2019-03-19 LAB — SARS CORONAVIRUS 2 (TAT 6-24 HRS): SARS Coronavirus 2: POSITIVE — AB

## 2019-03-19 LAB — C-REACTIVE PROTEIN: CRP: 12.7 mg/dL — ABNORMAL HIGH (ref <1.0)

## 2019-03-19 LAB — FIBRIN DERIVATIVES D-DIMER (ARMC ONLY): Fibrin derivatives D-dimer (ARMC): 4912.15 ng/mL (FEU) — ABNORMAL HIGH (ref 0.00–499.00)

## 2019-03-19 MED ORDER — ENALAPRIL MALEATE 10 MG PO TABS
10.0000 mg | ORAL_TABLET | Freq: Every day | ORAL | Status: DC
  Administered 2019-03-19 – 2019-03-25 (×7): 10 mg via ORAL
  Filled 2019-03-19 (×8): qty 1

## 2019-03-19 MED ORDER — VITAMIN D3 25 MCG (1000 UNIT) PO TABS
2000.0000 [IU] | ORAL_TABLET | Freq: Every day | ORAL | Status: DC
  Administered 2019-03-20 – 2019-03-25 (×6): 2000 [IU] via ORAL
  Filled 2019-03-19 (×13): qty 2

## 2019-03-19 MED ORDER — SODIUM CHLORIDE 0.9% FLUSH
10.0000 mL | INTRAVENOUS | Status: DC | PRN
  Administered 2019-03-19 – 2019-03-25 (×3): 10 mL

## 2019-03-19 MED ORDER — ASPIRIN EC 81 MG PO TBEC
81.0000 mg | DELAYED_RELEASE_TABLET | Freq: Every day | ORAL | Status: DC
  Administered 2019-03-19 – 2019-03-25 (×7): 81 mg via ORAL
  Filled 2019-03-19 (×7): qty 1

## 2019-03-19 MED ORDER — SODIUM CHLORIDE 0.9 % IV SOLN
100.0000 mg | Freq: Every day | INTRAVENOUS | Status: AC
  Administered 2019-03-19 – 2019-03-22 (×4): 100 mg via INTRAVENOUS
  Filled 2019-03-19 (×4): qty 100

## 2019-03-19 MED ORDER — COLCHICINE 0.6 MG PO TABS
0.6000 mg | ORAL_TABLET | Freq: Every day | ORAL | Status: DC
  Administered 2019-03-19 – 2019-03-25 (×7): 0.6 mg via ORAL
  Filled 2019-03-19 (×7): qty 1

## 2019-03-19 MED ORDER — CLOPIDOGREL BISULFATE 75 MG PO TABS
75.0000 mg | ORAL_TABLET | Freq: Every day | ORAL | Status: DC
  Administered 2019-03-19 – 2019-03-25 (×7): 75 mg via ORAL
  Filled 2019-03-19 (×7): qty 1

## 2019-03-19 MED ORDER — ENOXAPARIN SODIUM 40 MG/0.4ML ~~LOC~~ SOLN
40.0000 mg | SUBCUTANEOUS | Status: DC
  Administered 2019-03-19: 12:00:00 40 mg via SUBCUTANEOUS
  Filled 2019-03-19: qty 0.4

## 2019-03-19 MED ORDER — B COMPLEX-C PO TABS
1.0000 | ORAL_TABLET | Freq: Every day | ORAL | Status: DC
  Administered 2019-03-19 – 2019-03-25 (×7): 1 via ORAL
  Filled 2019-03-19 (×8): qty 1

## 2019-03-19 MED ORDER — DEXAMETHASONE SODIUM PHOSPHATE 10 MG/ML IJ SOLN
6.0000 mg | INTRAMUSCULAR | Status: DC
  Administered 2019-03-20 – 2019-03-23 (×4): 6 mg via INTRAMUSCULAR
  Filled 2019-03-19 (×7): qty 0.6

## 2019-03-19 MED ORDER — TURMERIC 500 MG PO CAPS: 500.0000 mg | ORAL_CAPSULE | Freq: Every day | ORAL | Status: DC

## 2019-03-19 MED ORDER — LACOSAMIDE 50 MG PO TABS
50.0000 mg | ORAL_TABLET | Freq: Two times a day (BID) | ORAL | Status: DC
  Administered 2019-03-19 – 2019-03-25 (×14): 50 mg via ORAL
  Filled 2019-03-19 (×14): qty 1

## 2019-03-19 MED ORDER — DEXAMETHASONE SODIUM PHOSPHATE 10 MG/ML IJ SOLN
6.0000 mg | Freq: Once | INTRAMUSCULAR | Status: AC
  Administered 2019-03-19: 12:00:00 6 mg via INTRAMUSCULAR
  Filled 2019-03-19: qty 1

## 2019-03-19 MED ORDER — ENOXAPARIN SODIUM 60 MG/0.6ML ~~LOC~~ SOLN
0.5000 mg/kg | SUBCUTANEOUS | Status: DC
  Administered 2019-03-20 – 2019-03-23 (×4): 45 mg via SUBCUTANEOUS
  Filled 2019-03-19 (×4): qty 0.6

## 2019-03-19 MED ORDER — MAGNESIUM OXIDE 400 (241.3 MG) MG PO TABS
200.0000 mg | ORAL_TABLET | Freq: Every day | ORAL | Status: DC
  Administered 2019-03-19 – 2019-03-25 (×7): 200 mg via ORAL
  Filled 2019-03-19 (×7): qty 1

## 2019-03-19 MED ORDER — ROSUVASTATIN CALCIUM 5 MG PO TABS
5.0000 mg | ORAL_TABLET | Freq: Every day | ORAL | Status: DC
  Administered 2019-03-19 – 2019-03-25 (×7): 5 mg via ORAL
  Filled 2019-03-19 (×7): qty 1

## 2019-03-19 MED ORDER — SODIUM CHLORIDE 0.9 % IV SOLN
200.0000 mg | Freq: Once | INTRAVENOUS | Status: AC
  Administered 2019-03-19: 200 mg via INTRAVENOUS
  Filled 2019-03-19: qty 200

## 2019-03-19 MED ORDER — FOLIC ACID 1 MG PO TABS
0.5000 mg | ORAL_TABLET | Freq: Every day | ORAL | Status: DC
  Administered 2019-03-19 – 2019-03-25 (×7): 0.5 mg via ORAL
  Filled 2019-03-19 (×7): qty 1

## 2019-03-19 MED ORDER — VITAMIN C 500 MG PO TABS
500.0000 mg | ORAL_TABLET | Freq: Every day | ORAL | Status: DC
  Administered 2019-03-20 – 2019-03-25 (×6): 500 mg via ORAL
  Filled 2019-03-19 (×5): qty 1

## 2019-03-19 MED ORDER — FLAX SEED OIL 1000 MG PO CAPS: 1000.0000 mg | ORAL_CAPSULE | Freq: Every day | ORAL | Status: DC

## 2019-03-19 MED ORDER — AMLODIPINE BESYLATE 5 MG PO TABS
5.0000 mg | ORAL_TABLET | Freq: Every day | ORAL | Status: DC
  Administered 2019-03-19 – 2019-03-25 (×7): 5 mg via ORAL
  Filled 2019-03-19 (×7): qty 1

## 2019-03-19 NOTE — ED Notes (Signed)
Pt resting on stretcher awake and alert. Answering questions without difficulty. Pt states she is feeling better. Breathing appears less labored and pt currently denies pain. Provided for comfort and safety and will continue to assess.

## 2019-03-19 NOTE — Progress Notes (Signed)
PHARMACIST - PHYSICIAN ORDER COMMUNICATION  CONCERNING: P&T Medication Policy on Herbal Medications  DESCRIPTION:  This patient's order for:  Turmeric/Flaxseed Oil  has been noted.  This product(s) is classified as an "herbal" or natural product. Due to a lack of definitive safety studies or FDA approval, nonstandard manufacturing practices, plus the potential risk of unknown drug-drug interactions while on inpatient medications, the Pharmacy and Therapeutics Committee does not permit the use of "herbal" or natural products of this type within [facility].   ACTION TAKEN: The pharmacy department is unable to verify this order at this time and your patient has been informed of this safety policy. Please reevaluate patient's clinical condition at discharge and address if the herbal or natural product(s) should be resumed at that time.  Lu Duffel, PharmD, BCPS Clinical Pharmacist 03/19/2019 1:35 PM

## 2019-03-19 NOTE — ED Notes (Addendum)
IV team at the bedside to restart IV.

## 2019-03-19 NOTE — ED Notes (Signed)
Pt returned from CT at this time. CBG checked by this RN. Pt states she does not want dinner at this time. Pt requesting only water.

## 2019-03-19 NOTE — ED Notes (Signed)
Dr. Manuella Ghazi notified of change in patient condition.

## 2019-03-19 NOTE — ED Notes (Addendum)
This RN and Mitch, RN to bedside at this time, pt noted to be diaphoretic, initial oral temp 99.1, rectal temp 98.9 at this time. VS stable at this time. This RN rechecked CBG, CBG 270. Pt moved to a hospital bed for comfort. Tele sitter remains at bedside at this time.   Pt's brief checked, noted to be clean and dry, pt noted to have dime sized stage 2 pressure ulcer forming on sacrum, this RN applied barrier cream and mepilex dressing to prevent further breakdown.

## 2019-03-19 NOTE — ED Notes (Signed)
This RN to bedside, pt placed on cardiac monitoring for better patient monitoring. Meds administered with apple sauce, pt repositioned in bed, seizure pad placed to R side due to R side deficits and patient leaning to R side and her R arm falling through the bars. Pt alert and oriented, slurred speech at baseline. Pt repositioned, this RN checked position of purewick, brief noted to be clean and dry. VSS. Pt remains on 2L via [place]. Will continue to monitor for further patients needs.

## 2019-03-19 NOTE — ED Notes (Signed)
Pt repositioned in bed at this time. Pt noted to not have eaten lunch. Pt denies being hungry at this time.

## 2019-03-19 NOTE — ED Notes (Signed)
Pt visualized resting in bed, NAD noted, eyes closed. VSS. Tele-sitter remains at bedside at this time. Will continue to monitor for further patient needs.

## 2019-03-19 NOTE — ED Notes (Signed)
Attempted to call report at this time. After being on hold for >8 mins, the RN that answered stated the room assigned "was not mine" and hung up. Attempting to call report again at this time and currently on hold again.

## 2019-03-19 NOTE — Progress Notes (Signed)
Remdesivir - Pharmacy Brief Note   O:  ALT:  CXR:  SpO2: % on    A/P:  Remdesivir 200 mg IVPB once followed by 100 mg IVPB daily x 4 days.   Hart Robinsons, PharmD Clinical Pharmacist  03/19/2019   03/19/2019 3:03 AM

## 2019-03-19 NOTE — ED Notes (Signed)
Spoke with Pt's niece with approval from pt and provided an update.

## 2019-03-19 NOTE — ED Notes (Signed)
Provided pt's daughter [name] with an update, w/ permission from the pt.

## 2019-03-19 NOTE — Progress Notes (Signed)
1        [place] at [place] NAME: Carmen Olson    MR#:  [id number]  DATE OF BIRTH:  [birth year]  SUBJECTIVE:  CHIEF COMPLAINT:   Chief Complaint  Patient presents with  . Shortness of Breath  Short of breath REVIEW OF SYSTEMS:  Review of Systems  Constitutional: Positive for malaise/fatigue. Negative for diaphoresis, fever and weight loss.  HENT: Negative for ear discharge, ear pain, hearing loss, nosebleeds, sore throat and tinnitus.   Eyes: Negative for blurred vision and pain.  Respiratory: Positive for shortness of breath. Negative for cough, hemoptysis and wheezing.   Cardiovascular: Negative for chest pain, palpitations, orthopnea and leg swelling.  Gastrointestinal: Negative for abdominal pain, blood in stool, constipation, diarrhea, heartburn, nausea and vomiting.  Genitourinary: Negative for dysuria, frequency and urgency.  Musculoskeletal: Negative for back pain and myalgias.  Skin: Negative for itching and rash.  Neurological: Negative for dizziness, tingling, tremors, focal weakness, seizures, weakness and headaches.  Psychiatric/Behavioral: Negative for depression. The patient is not nervous/anxious.    DRUG ALLERGIES:   Allergies  Allergen Reactions  . Sulfa Antibiotics Itching   VITALS:  Blood pressure (!) 147/80, pulse 88, temperature 98.9 F (37.2 C), temperature source Oral, resp. rate (!) 23, height 5' (1.524 m), weight 90.7 kg, SpO2 96 %. PHYSICAL EXAMINATION:  Physical Exam HENT:     Head: Normocephalic and atraumatic.  Eyes:     Conjunctiva/sclera: Conjunctivae normal.     Pupils: Pupils are equal, round, and reactive to light.  Neck:     Thyroid: No thyromegaly.     Trachea: No tracheal deviation.  Cardiovascular:     Rate and Rhythm: Normal rate and regular rhythm.     Heart sounds: Normal heart sounds.  Pulmonary:     Effort: Pulmonary effort is normal. No respiratory distress.     Breath sounds: Normal breath  sounds. No wheezing.  Chest:     Chest wall: No tenderness.  Abdominal:     General: Bowel sounds are normal. There is no distension.     Palpations: Abdomen is soft.     Tenderness: There is no abdominal tenderness.  Musculoskeletal:        General: Normal range of motion.     Cervical back: Normal range of motion and neck supple.  Skin:    General: Skin is warm and dry.     Findings: No rash.  Neurological:     Mental Status: She is alert and oriented to person, place, and time.     Cranial Nerves: No cranial nerve deficit.    LABORATORY PANEL:  Female CBC Recent Labs  Lab 03/19/19 0936  WBC 10.4  HGB 12.4  HCT 36.7  PLT 175   ------------------------------------------------------------------------------------------------------------------ Chemistries  Recent Labs  Lab 03/18/19 1435 03/18/19 1459  NA 138  --   K 3.5  --   CL 101  --   CO2 25  --   GLUCOSE 160*  --   BUN 17  --   CREATININE 0.87  --   CALCIUM 8.5*  --   AST  --  87*  ALT  --  71*  ALKPHOS  --  28*  BILITOT  --  0.7   RADIOLOGY:  DG Chest 2 View  Result Date: 03/18/2019 CLINICAL DATA:  Shortness of breath, COVID-19 positive EXAM: CHEST - 2 VIEW COMPARISON:  None. FINDINGS: Airspace opacity present in the left mid to lower lung as well  as more pain streaky opacity in the right infrahilar lung. No pneumothorax or effusion. The cardiomediastinal contours are unremarkable. No acute osseous or soft tissue abnormality. Degenerative changes are present in the imaged spine and shoulders. IMPRESSION: Features worrisome for multifocal pneumonia, particularly in the setting of COVID-19 positivity. Electronically Signed   By: Lovena Le M.D.   On: 03/18/2019 15:11   ASSESSMENT AND PLAN:   COVID-19 with Hypoxia -this is PUI Patient had a positive Covid result at [place] on 12/3 per records.  Her PCR for Covid was negative in the emergency department.  I have ordered another Covid  which is 6 to 12-hour test Patient with desaturation to 88 with ambulation. Maintaining O2 saturations on 2L [place]. CXR concerning for multifocal pneumonia particularly in setting of COVID+ result. PCT negative. ESR 67. Leukocytosis 11.6. AST/ALT 87/71. Will not treat as bacterial pneumonia at present given negative procalcitonin and only minimally elevated WBC.  -Continue Decadron and remdesivir -CRP 12.7  -Monitor D-dimer, LDH & CRP daily  -repeat CBC and CMET in AM  -low threshold to add antibiotics to treat for bacterial pneumonia if patient worsens   T2DM  CBG 170 at admit.  -hold metformin/glipizide given acute illness  -CBGs 4x daily  -add SSI if glucose >200, will likely occur with steroid use   HTN   -continue Enalapril and Amlodipine   History of CVA  Residual left hemiparesis.  -continue Plavix, crestor and ASA   Cirrhosis with H/o Hep C  AST/ALT 87/71 -repeat CMET in AM   Some concern for cognitive and behavioral changes with possible dementia Work-up in progress by her primary care physician in collaboration with neurology as an outpatient Family is concerned about worsening memory recall Sister with diagnosis of Alzheimer's disease   Patient and family requests staying at [facility] then transferred to [facility].   All the records are reviewed and case discussed with Care Management/Social Worker. Management plans discussed with the patient, nursing and they are in agreement.  CODE STATUS: Full Code  TOTAL TIME TAKING CARE OF THIS PATIENT: 35 minutes.   More than 50% of the time was spent in counseling/coordination of care: YES  POSSIBLE D/C IN 4-5 DAYS, DEPENDING ON CLINICAL CONDITION.   Max Sane M.D on 03/19/2019 at 1:14 PM  Between 7am to 6pm - Pager - [phone]  After 6pm go to [email or web address] - password [email or web address]  [facility]   CC: Primary care physician; Nathanial Millman, PA  Note: This dictation was prepared with Dragon dictation  along with smaller phrase technology. Any transcriptional errors that result from this process are unintentional.

## 2019-03-19 NOTE — ED Notes (Signed)
Lights dimmed for patient comfort. Will continue to monitor for further patient needs.

## 2019-03-19 NOTE — Progress Notes (Signed)
Nurse reported increasing lethargy over period of day. CT head (considering h/o stroke) STAT shows no acute patho

## 2019-03-19 NOTE — Plan of Care (Signed)
  Problem: Education: Goal: Knowledge of General Education information will improve Description: Including pain rating scale, medication(s)/side effects and non-pharmacologic comfort measures Outcome: Progressing Note: Patient is lethargic, unable to answer questions. Family was contacted to complete.

## 2019-03-19 NOTE — ED Notes (Signed)
Provided pt w/ meal tray, she stated she wasn't hungry at this time.

## 2019-03-19 NOTE — ED Notes (Signed)
Admitting MD made aware that patient becoming more somnolent.

## 2019-03-19 NOTE — ED Notes (Signed)
Pt with no drift noted to R arm, no nystagmus, no gaze deviation, pt denies numbness upon assessment. Pt with noted weakness to bilateral legs and limited movement to L upper extremity at baseline. Pt noted to be slower to answer than she was this AM. Will continue to monitor.

## 2019-03-19 NOTE — Progress Notes (Signed)
Patient is lethargic. Awaken with rigorous stimuli. Notified Carmen Olson about current condition. ABG ordered. Patient awaken to take medication.

## 2019-03-20 LAB — COMPREHENSIVE METABOLIC PANEL
ALT: 61 U/L — ABNORMAL HIGH (ref 0–44)
AST: 68 U/L — ABNORMAL HIGH (ref 15–41)
Albumin: 3.3 g/dL — ABNORMAL LOW (ref 3.5–5.0)
Alkaline Phosphatase: 31 U/L — ABNORMAL LOW (ref 38–126)
Anion gap: 10 (ref 5–15)
BUN: 18 mg/dL (ref 8–23)
CO2: 27 mmol/L (ref 22–32)
Calcium: 8.2 mg/dL — ABNORMAL LOW (ref 8.9–10.3)
Chloride: 103 mmol/L (ref 98–111)
Creatinine, Ser: 0.69 mg/dL (ref 0.44–1.00)
GFR calc Af Amer: >60 mL/min (ref >60)
GFR calc non Af Amer: >60 mL/min (ref >60)
Glucose, Bld: 254 mg/dL — ABNORMAL HIGH (ref 70–99)
Potassium: 3.7 mmol/L (ref 3.5–5.1)
Sodium: 140 mmol/L (ref 135–145)
Total Bilirubin: 0.7 mg/dL (ref 0.3–1.2)
Total Protein: 7 g/dL (ref 6.5–8.1)

## 2019-03-20 LAB — HEMOGLOBIN A1C
Hgb A1c MFr Bld: 6.9 % — ABNORMAL HIGH (ref 4.8–5.6)
Mean Plasma Glucose: 151.33 mg/dL

## 2019-03-20 LAB — URINALYSIS, COMPLETE (UACMP) WITH MICROSCOPIC
Bilirubin Urine: NEGATIVE
Glucose, UA: 50 mg/dL — AB
Hgb urine dipstick: NEGATIVE
Ketones, ur: NEGATIVE mg/dL
Leukocytes,Ua: NEGATIVE
Nitrite: NEGATIVE
Protein, ur: 100 mg/dL — AB
Specific Gravity, Urine: 1.032 — ABNORMAL HIGH (ref 1.005–1.030)
WBC, UA: NONE SEEN WBC/hpf (ref 0–5)
pH: 5 (ref 5.0–8.0)

## 2019-03-20 LAB — FIBRIN DERIVATIVES D-DIMER (ARMC ONLY): Fibrin derivatives D-dimer (ARMC): >7500 ng/mL (FEU) — ABNORMAL HIGH (ref 0.00–499.00)

## 2019-03-20 LAB — C-REACTIVE PROTEIN: CRP: 14.1 mg/dL — ABNORMAL HIGH (ref <1.0)

## 2019-03-20 LAB — CBC
HCT: 36.7 % (ref 36.0–46.0)
Hemoglobin: 12 g/dL (ref 12.0–15.0)
MCH: 29.6 pg (ref 26.0–34.0)
MCHC: 32.7 g/dL (ref 30.0–36.0)
MCV: 90.6 fL (ref 80.0–100.0)
Platelets: 199 10*3/uL (ref 150–400)
RBC: 4.05 MIL/uL (ref 3.87–5.11)
RDW: 12.5 % (ref 11.5–15.5)
WBC: 7.5 10*3/uL (ref 4.0–10.5)
nRBC: 0 % (ref 0.0–0.2)

## 2019-03-20 LAB — FERRITIN: Ferritin: 405 ng/mL — ABNORMAL HIGH (ref 11–307)

## 2019-03-20 LAB — GLUCOSE, CAPILLARY
Glucose-Capillary: 243 mg/dL — ABNORMAL HIGH (ref 70–99)
Glucose-Capillary: 284 mg/dL — ABNORMAL HIGH (ref 70–99)
Glucose-Capillary: 351 mg/dL — ABNORMAL HIGH (ref 70–99)
Glucose-Capillary: 333 mg/dL — ABNORMAL HIGH (ref 70–99)

## 2019-03-20 LAB — LACTATE DEHYDROGENASE: LDH: 331 U/L — ABNORMAL HIGH (ref 98–192)

## 2019-03-20 MED ORDER — INSULIN ASPART 100 UNIT/ML ~~LOC~~ SOLN
0.0000 [IU] | Freq: Three times a day (TID) | SUBCUTANEOUS | Status: DC
  Administered 2019-03-20: 15 [IU] via SUBCUTANEOUS
  Administered 2019-03-21: 13:00:00 11 [IU] via SUBCUTANEOUS
  Administered 2019-03-21: 5 [IU] via SUBCUTANEOUS
  Administered 2019-03-21: 8 [IU] via SUBCUTANEOUS
  Administered 2019-03-22: 5 [IU] via SUBCUTANEOUS
  Administered 2019-03-22: 8 [IU] via SUBCUTANEOUS
  Administered 2019-03-23: 5 [IU] via SUBCUTANEOUS
  Administered 2019-03-23 (×2): 3 [IU] via SUBCUTANEOUS
  Administered 2019-03-24: 2 [IU] via SUBCUTANEOUS
  Administered 2019-03-24: 3 [IU] via SUBCUTANEOUS
  Administered 2019-03-24: 8 [IU] via SUBCUTANEOUS
  Administered 2019-03-25: 09:00:00 3 [IU] via SUBCUTANEOUS
  Administered 2019-03-25: 15 [IU] via SUBCUTANEOUS
  Administered 2019-03-25: 8 [IU] via SUBCUTANEOUS
  Filled 2019-03-20 (×13): qty 1

## 2019-03-20 NOTE — Progress Notes (Signed)
Patient was yelling this morning, without making any comprehensible sentences. Patient has legs hanging out the bed. Then patient begin repeating loudly statements made by staff. Placed a tele monitor in the room to observe activity. Telesitter will be activated after 0600.

## 2019-03-20 NOTE — Progress Notes (Signed)
[place] [facility] [place] NAME: Carmen Olson    MR#:  [id number]  DATE OF BIRTH:  02-02-1946  SUBJECTIVE:  Patient patient was somewhat confused this morning.  Was yelling at nurses but patient was incomprehensible.  Was requiring supplemental oxygen of 2 L via nasal cannula.  Chief Complaint  Patient presents with  . Shortness of Breath  Short of breath REVIEW OF SYSTEMS:  Review of Systems  Constitutional: Positive for malaise/fatigue. Negative for diaphoresis, fever and weight loss.  HENT: Negative for ear discharge, ear pain, hearing loss, nosebleeds, sore throat and tinnitus.   Eyes: Negative for blurred vision and pain.  Respiratory: Positive for shortness of breath. Negative for cough, hemoptysis and wheezing.   Cardiovascular: Negative for chest pain, palpitations, orthopnea and leg swelling.  Gastrointestinal: Negative for abdominal pain, blood in stool, constipation, diarrhea, heartburn, nausea and vomiting.  Genitourinary: Negative for dysuria, frequency and urgency.  Musculoskeletal: Negative for back pain and myalgias.  Skin: Negative for itching and rash.  Neurological: Negative for dizziness, tingling, tremors, focal weakness, seizures, weakness and headaches.  Psychiatric/Behavioral: Negative for depression. The patient is not nervous/anxious.    DRUG ALLERGIES:   Allergies  Allergen Reactions  . Sulfa Antibiotics Itching   VITALS:  Blood pressure 130/69, pulse 78, temperature 98.4 F (36.9 C), temperature source Oral, resp. rate 18, height 5' (1.524 m), weight 90.7 kg, SpO2 98 %. PHYSICAL EXAMINATION:  PEx from 03/19/19   HENT:     Head: Normocephalic and atraumatic.  Eyes:     Conjunctiva/sclera: Conjunctivae normal.     Pupils: Pupils are equal, round, and reactive to light.  Neck:     Thyroid: No thyromegaly.     Trachea: No tracheal deviation.  Cardiovascular:     Rate and Rhythm: Normal rate and regular rhythm.   Heart sounds: Normal heart sounds.  Pulmonary:     Effort: Pulmonary effort is normal. No respiratory distress.     Breath sounds: Normal breath sounds. No wheezing.  Chest:     Chest wall: No tenderness.  Abdominal:     General: Bowel sounds are normal. There is no distension.     Palpations: Abdomen is soft.     Tenderness: There is no abdominal tenderness.  Musculoskeletal:        General: Normal range of motion.     Cervical back: Normal range of motion and neck supple.  Skin:    General: Skin is warm and dry.     Findings: No rash.  Neurological:     Mental Status: She is alert and oriented to person, place, and time.     Cranial Nerves: No cranial nerve deficit.   LABORATORY PANEL:  Female CBC Recent Labs  Lab 03/20/19 0257  WBC 7.5  HGB 12.0  HCT 36.7  PLT 199   ------------------------------------------------------------------------------------------------------------------ Chemistries  Recent Labs  Lab 03/20/19 0257  NA 140  K 3.7  CL 103  CO2 27  GLUCOSE 254*  BUN 18  CREATININE 0.69  CALCIUM 8.2*  AST 68*  ALT 61*  ALKPHOS 31*  BILITOT 0.7   RADIOLOGY:  CT HEAD WO CONTRAST  Result Date: 03/19/2019 CLINICAL DATA:  Altered mental status.  COVID-19 positive EXAM: CT HEAD WITHOUT CONTRAST TECHNIQUE: Contiguous axial images were obtained from the base of the skull through the vertex without intravenous contrast. COMPARISON:  None. FINDINGS: Brain: Large territory chronic right MCA infarct. Chronic infarct left  internal capsule. Most white matter likely chronic ischemia hypodensity in the frontal lobes bilaterally. Generalized atrophy without hydrocephalus. Negative for acute infarct, hemorrhage, or mass. Vascular: Negative for hyperdense vessel Skull: Negative Sinuses/Orbits: Negative Other: None IMPRESSION: No acute abnormality. Chronic right MCA infarct. Chronic microvascular ischemia. Electronically Signed   By: Franchot Gallo M.D.   On: 03/19/2019 18:58    ASSESSMENT AND PLAN:   DTHYH-88 with Hypoxia -this is a PUI Patient had a positive Covid result at [place] on 12/3 per records.  Her PCR for Covid was negative in the emergency department. Another Covid test send out blood on 12/11 came back positive. Was in desaturation to 88 with ambulation at the time of admission. -Currently maintaining O2 saturations on 2L [place]. -Admission CXR concerning for multifocal pneumonia particularly in setting of COVID+ result. PCT negative. ESR 67. -Antibiotic was not started for bacterial pneumonia at present given negative procalcitonin and only minimally elevated WBC.  Chest x-ray is not also typical for Covid pneumonia  -Continue Decadron day 2 of 10 and remdesivir day 2 of 5 from admission -CRP 12.7  -Monitor D-dimer, LDH & CRP daily  -FDP worsened -repeat CBC and CMET follow -low threshold to add antibiotics to treat for bacterial pneumonia if patient worsens   T2DM  CBG 170 at admit.  -hold metformin/glipizide given acute illness  -CBGs 4x daily  -add SSI if glucose >200, will likely occur with steroid use   HTN   -continue Enalapril and Amlodipine   History of CVA  -Acute issue Residual left hemiparesis.  -continue Plavix, crestor and ASA   Cirrhosis with H/o Hep C  AST/ALT 87/71 -repeat CMET in AM   Some concern for cognitive and behavioral changes with possible dementia Work-up in progress by her primary care physician in collaboration with neurology as an outpatient Family is concerned about worsening memory recall Sister with diagnosis of Alzheimer's disease -Follow-up outpatient  Patient and family requests staying at [facility] then transferred to [facility].   All the records are reviewed and case discussed with Care Management/Social Worker. Management plans discussed with the patient, nursing and they are in agreement.  CODE STATUS: Full Code  TOTAL TIME TAKING CARE OF THIS PATIENT: 35 minutes.    More than 50% of the time was spent in counseling/coordination of care: YES  POSSIBLE D/C IN 4-5 DAYS, DEPENDING ON CLINICAL CONDITION.   Thornell Mule M.D on 03/20/2019 at 1:50 PM  Between 7am to 6pm - Pager - [phone]  After 6pm go to [email or web address] - password [email or web address]  [facility]   CC: Primary care physician; Nathanial Millman, PA  Note: This dictation was prepared with Dragon dictation along with smaller phrase technology. Any transcriptional errors that result from this process are unintentional.

## 2019-03-21 LAB — COMPREHENSIVE METABOLIC PANEL
ALT: 54 U/L — ABNORMAL HIGH (ref 0–44)
AST: 48 U/L — ABNORMAL HIGH (ref 15–41)
Albumin: 3.3 g/dL — ABNORMAL LOW (ref 3.5–5.0)
Alkaline Phosphatase: 34 U/L — ABNORMAL LOW (ref 38–126)
Anion gap: 13 (ref 5–15)
BUN: 24 mg/dL — ABNORMAL HIGH (ref 8–23)
CO2: 25 mmol/L (ref 22–32)
Calcium: 8.8 mg/dL — ABNORMAL LOW (ref 8.9–10.3)
Chloride: 103 mmol/L (ref 98–111)
Creatinine, Ser: 0.77 mg/dL (ref 0.44–1.00)
GFR calc Af Amer: >60 mL/min (ref >60)
GFR calc non Af Amer: >60 mL/min (ref >60)
Glucose, Bld: 310 mg/dL — ABNORMAL HIGH (ref 70–99)
Potassium: 4.1 mmol/L (ref 3.5–5.1)
Sodium: 141 mmol/L (ref 135–145)
Total Bilirubin: 0.5 mg/dL (ref 0.3–1.2)
Total Protein: 7 g/dL (ref 6.5–8.1)

## 2019-03-21 LAB — FERRITIN: Ferritin: 490 ng/mL — ABNORMAL HIGH (ref 11–307)

## 2019-03-21 LAB — GLUCOSE, CAPILLARY
Glucose-Capillary: 255 mg/dL — ABNORMAL HIGH (ref 70–99)
Glucose-Capillary: 321 mg/dL — ABNORMAL HIGH (ref 70–99)
Glucose-Capillary: 240 mg/dL — ABNORMAL HIGH (ref 70–99)
Glucose-Capillary: 303 mg/dL — ABNORMAL HIGH (ref 70–99)

## 2019-03-21 LAB — FIBRIN DERIVATIVES D-DIMER (ARMC ONLY): Fibrin derivatives D-dimer (ARMC): 3754.74 ng/mL (FEU) — ABNORMAL HIGH (ref 0.00–499.00)

## 2019-03-21 LAB — C-REACTIVE PROTEIN: CRP: 6.8 mg/dL — ABNORMAL HIGH (ref <1.0)

## 2019-03-21 MED ORDER — INSULIN ASPART 100 UNIT/ML ~~LOC~~ SOLN
10.0000 [IU] | Freq: Three times a day (TID) | SUBCUTANEOUS | Status: DC
  Administered 2019-03-21 – 2019-03-25 (×12): 10 [IU] via SUBCUTANEOUS
  Filled 2019-03-21 (×12): qty 1

## 2019-03-21 MED ORDER — INSULIN GLARGINE 100 UNIT/ML ~~LOC~~ SOLN
8.0000 [IU] | Freq: Every day | SUBCUTANEOUS | Status: DC
  Administered 2019-03-21 – 2019-03-25 (×5): 8 [IU] via SUBCUTANEOUS
  Filled 2019-03-21 (×5): qty 0.08

## 2019-03-21 NOTE — Progress Notes (Addendum)
Carmen Olson at [place] NAME: Carmen Olson    MR#:  [id number]  DATE OF BIRTH:  1945-05-20  SUBJECTIVE:  No acute issues this morning.  No fever or worsening shortness of breath.  Chief Complaint  Patient presents with  . Shortness of Breath  Short of breath REVIEW OF SYSTEMS:  Review of Systems  Constitutional: Positive for malaise/fatigue. Negative for diaphoresis, fever and weight loss.  HENT: Negative for ear discharge, ear pain, hearing loss, nosebleeds, sore throat and tinnitus.   Eyes: Negative for blurred vision and pain.  Respiratory: Positive for shortness of breath. Negative for cough, hemoptysis and wheezing.   Cardiovascular: Negative for chest pain, palpitations, orthopnea and leg swelling.  Gastrointestinal: Negative for abdominal pain, blood in stool, constipation, diarrhea, heartburn, nausea and vomiting.  Genitourinary: Negative for dysuria, frequency and urgency.  Musculoskeletal: Negative for back pain and myalgias.  Skin: Negative for itching and rash.  Neurological: Negative for dizziness, tingling, tremors, focal weakness, seizures, weakness and headaches.  Psychiatric/Behavioral: Negative for depression. The patient is not nervous/anxious.    DRUG ALLERGIES:   Allergies  Allergen Reactions  . Sulfa Antibiotics Itching   VITALS:  Blood pressure (!) 145/68, pulse 80, temperature 97.9 F (36.6 C), temperature source Oral, resp. rate (!) 22, height 5' (1.524 m), weight 90.7 kg, SpO2 95 %. PHYSICAL EXAMINATION:  PEx from 03/19/19   HENT:     Head: Normocephalic and atraumatic.  Eyes:     Conjunctiva/sclera: Conjunctivae normal.     Pupils: Pupils are equal, round, and reactive to light.  Neck:     Thyroid: No thyromegaly.     Trachea: No tracheal deviation.  Cardiovascular:     Rate and Rhythm: Normal rate and regular rhythm.     Heart sounds: Normal heart sounds.  Pulmonary:     Effort: Pulmonary effort is normal.  No respiratory distress.     Breath sounds: Normal breath sounds. No wheezing.  Chest:     Chest wall: No tenderness.  Abdominal:     General: Bowel sounds are normal. There is no distension.     Palpations: Abdomen is soft.     Tenderness: There is no abdominal tenderness.  Musculoskeletal:        General: Normal range of motion.     Cervical back: Normal range of motion and neck supple.  Skin:    General: Skin is warm and dry.     Findings: No rash.  Neurological:     Mental Status: Alert  LABORATORY PANEL:  Female CBC Recent Labs  Lab 03/20/19 0257  WBC 7.5  HGB 12.0  HCT 36.7  PLT 199   ------------------------------------------------------------------------------------------------------------------ Chemistries  Recent Labs  Lab 03/20/19 0257  NA 140  K 3.7  CL 103  CO2 27  GLUCOSE 254*  BUN 18  CREATININE 0.69  CALCIUM 8.2*  AST 68*  ALT 61*  ALKPHOS 31*  BILITOT 0.7   RADIOLOGY:  No results found. ASSESSMENT AND PLAN:   SPQZR-00 with Hypoxia -this is a PUI Patient had a positive Covid result at [place] on 12/3 per records.  Her PCR for Covid was negative in the emergency department. Another Covid test sent out blood on 12/11 came back positive. Was in desaturation to 88 with ambulation at the time of admission. -Currently maintaining O2 saturations on 2L [place]. -Admission CXR concerning for multifocal pneumonia particularly in setting of COVID+ result.  PCT negative. ESR 67. -Antibiotic was not started for bacterial pneumonia at present given negative procalcitonin and only minimally elevated WBC.  Chest x-ray is not also typical for Covid pneumonia  -Continue Decadron day 3 of 10 and remdesivir day 3 of 5 from admission -CRP 12.7  -Monitor D-dimer, FDP, LDH & CRP; pending today -repeat CBC and CMET follow--labs pending -low threshold to add antibiotics to treat for bacterial pneumonia if patient worsens   T2DM: Stable -hold  metformin/glipizide given acute illness  -CBGs 4x daily  -add SSI if glucose >200, will likely occur with steroid use   HTN   -continue Enalapril and Amlodipine   History of CVA  -No acute issue Residual left hemiparesis.  -continue Plavix, crestor and ASA   Cirrhosis with H/o Hep C  AST/ALT 87/71 -repeat CMET shows decreased in number while bilirubin is within normal limit  Confusion/behavioral changes: From admission some concern for cognitive and behavioral changes with possible dementia Work-up in progress by her primary care physician in collaboration with neurology as an outpatient Family is concerned about worsening memory recall Sister with diagnosis of Alzheimer's disease -Follow-up outpatient  Patient and family requests staying at [facility] then transferred to [facility]. Patient is currently requiring no more than 2 L supplemental oxygen which does not qualify her to be transferred to [facility].   All the records are reviewed and case discussed with Care Management/Social Worker. Management plans discussed with the patient, nursing and they are in agreement.  Disposition:  Pt was living home with sister and receiving [place] full time. Spoke with Pt's niece, POA, Carmen Olson. They would like Pt to be discharged to home with [facility] resumption.   CODE STATUS: Full Code  TOTAL TIME TAKING CARE OF THIS PATIENT: 35 minutes.   More than 50% of the time was spent in counseling/coordination of care: YES  POSSIBLE D/C IN 4-5 DAYS, DEPENDING ON CLINICAL CONDITION.   Thornell Mule M.D on 03/21/2019 at 1:06 PM  Between 7am to 6pm - Pager - [phone]  After 6pm go to [email or web address] - password [email or web address]  [facility]   CC: Primary care physician; Carmen Millman, PA  Note: This dictation was prepared with Dragon dictation along with smaller phrase technology. Any transcriptional errors that result from this process are unintentional.

## 2019-03-22 LAB — CBC WITH DIFFERENTIAL/PLATELET
Abs Immature Granulocytes: 0.27 10*3/uL — ABNORMAL HIGH (ref 0.00–0.07)
Basophils Absolute: 0.1 10*3/uL (ref 0.0–0.1)
Basophils Relative: 1 %
Eosinophils Absolute: 0 10*3/uL (ref 0.0–0.5)
Eosinophils Relative: 0 %
HCT: 42 % (ref 36.0–46.0)
Hemoglobin: 13.4 g/dL (ref 12.0–15.0)
Immature Granulocytes: 3 %
Lymphocytes Relative: 29 %
Lymphs Abs: 3.1 10*3/uL (ref 0.7–4.0)
MCH: 30 pg (ref 26.0–34.0)
MCHC: 31.9 g/dL (ref 30.0–36.0)
MCV: 94 fL (ref 80.0–100.0)
Monocytes Absolute: 0.9 10*3/uL (ref 0.1–1.0)
Monocytes Relative: 9 %
Neutro Abs: 6.4 10*3/uL (ref 1.7–7.7)
Neutrophils Relative %: 58 %
Platelets: 299 10*3/uL (ref 150–400)
RBC: 4.47 MIL/uL (ref 3.87–5.11)
RDW: 12.3 % (ref 11.5–15.5)
WBC: 10.8 10*3/uL — ABNORMAL HIGH (ref 4.0–10.5)
nRBC: 0 % (ref 0.0–0.2)

## 2019-03-22 LAB — GLUCOSE, CAPILLARY
Glucose-Capillary: 278 mg/dL — ABNORMAL HIGH (ref 70–99)
Glucose-Capillary: 219 mg/dL — ABNORMAL HIGH (ref 70–99)

## 2019-03-22 LAB — COMPREHENSIVE METABOLIC PANEL
ALT: 50 U/L — ABNORMAL HIGH (ref 0–44)
AST: 40 U/L (ref 15–41)
Albumin: 3.3 g/dL — ABNORMAL LOW (ref 3.5–5.0)
Alkaline Phosphatase: 32 U/L — ABNORMAL LOW (ref 38–126)
Anion gap: 12 (ref 5–15)
BUN: 29 mg/dL — ABNORMAL HIGH (ref 8–23)
CO2: 27 mmol/L (ref 22–32)
Calcium: 8.7 mg/dL — ABNORMAL LOW (ref 8.9–10.3)
Chloride: 104 mmol/L (ref 98–111)
Creatinine, Ser: 0.76 mg/dL (ref 0.44–1.00)
GFR calc Af Amer: >60 mL/min (ref >60)
GFR calc non Af Amer: >60 mL/min (ref >60)
Glucose, Bld: 234 mg/dL — ABNORMAL HIGH (ref 70–99)
Potassium: 3.9 mmol/L (ref 3.5–5.1)
Sodium: 143 mmol/L (ref 135–145)
Total Bilirubin: 0.6 mg/dL (ref 0.3–1.2)
Total Protein: 7 g/dL (ref 6.5–8.1)

## 2019-03-22 LAB — URINE CULTURE

## 2019-03-22 LAB — FIBRIN DERIVATIVES D-DIMER (ARMC ONLY): Fibrin derivatives D-dimer (ARMC): 5770.25 ng/mL (FEU) — ABNORMAL HIGH (ref 0.00–499.00)

## 2019-03-22 LAB — FERRITIN: Ferritin: 353 ng/mL — ABNORMAL HIGH (ref 11–307)

## 2019-03-22 LAB — C-REACTIVE PROTEIN: CRP: 3.5 mg/dL — ABNORMAL HIGH (ref <1.0)

## 2019-03-22 MED ORDER — DEXAMETHASONE 6 MG PO TABS: 6.0000 mg | ORAL_TABLET | Freq: Every day | ORAL | 0 refills | Status: AC

## 2019-03-22 MED ORDER — INSULIN ASPART 100 UNIT/ML ~~LOC~~ SOLN: 10.0000 [IU] | Freq: Three times a day (TID) | SUBCUTANEOUS | 1 refills | Status: DC

## 2019-03-22 MED ORDER — INSULIN GLARGINE 100 UNIT/ML ~~LOC~~ SOLN: 10.0000 [IU] | Freq: Every day | SUBCUTANEOUS | 1 refills | Status: AC

## 2019-03-22 NOTE — TOC Transition Note (Signed)
Transition of Care [facility]) - CM/SW Discharge Note   Patient Details  Name: Tessi Eustache MRN: [id number] Date of Birth: 1945/12/29  [facility] of Care [facility]) CM/SW Contact:  Candie Chroman, LCSW Phone Number: 03/22/2019, 1:47 PM   Clinical Narrative:  Patient has orders to discharge home today. Per niece, patient's aide will be at her home around 6/7:00 tonight and will call the unit when she arrives so the nurse can set up EMS transport. CSW confirmed address with niece. Per RN, patient has been weaned to room air. No further concerns. CSW signing off.  Final next level of care: [place] [facility](CAPS services through [facility].) Barriers to Discharge: Barriers Resolved   Patient Goals and CMS Choice Patient states their goals for this hospitalization and ongoing recovery are:: Patient not fully oriented.   Choice offered to / list presented to : NA  Discharge Placement                Patient to be transferred to facility by: EMS Name of family member notified: Mervin Kung Patient and family notified of of transfer: 03/22/19  Discharge Plan and Services     Post Acute Care Choice: Resumption of Svcs/PTA Provider                               Social Determinants of Health (SDOH) Interventions     Readmission Risk Interventions No flowsheet data found.

## 2019-03-22 NOTE — Discharge Summary (Addendum)
Physician Discharge Summary  Patient ID: Carmen Olson MRN: [id number] DOB/AGE: Sep 25, 1945 73 y.o.  Admit date: 03/18/2019 Discharge date: 03/22/2019  Admission Diagnoses:  Discharge Diagnoses:  Active Problems:   Alcoholic cirrhosis of liver without ascites (HCC)   CVA, old, hemiparesis ([place])   Essential hypertension   Hx of hepatitis C   Type 2 diabetes mellitus without complication, without long-term current use of insulin (HCC)   COVID-19   Hypoxia   Pressure injury of skin   Discharged Condition: fair  Hospital Course:  HPI on admission: Carmen Olson is a 73 y.o. female with PMH of CVA with left hemiparesis, HTN, T2DM, liver cirrhosis and history of Hep C who presents for shortness of breath and fever. Patient tested positive for COVID last week. Now feeling more symptomatic.   COVID-19 with Hypoxia -Covid 19 tested positive Patient had a positive Covid result at [place] on 12/3 per records.  Her PCR for Covid was negative in the emergency department. Another Covid test sent out blood on 12/11 came back positive. Was in desaturation to 88 with ambulation at the time of admission. -Currently much improved and maintain saturation well above 90% on room air  -Admission CXR concerning for multifocal pneumonia particularly in setting of COVID+ result. PCT negative. ESR 67. -Antibiotic was not started for bacterial pneumonia at present given negative procalcitonin and only minimally elevated WBC.  Chest x-ray is not also typical for Covid pneumonia -Patient received remdesivir 4 doses -And Decadron 4 doses of 10; went home with p.o. Decadron for total of 6 days -Inflammatory markers were stably downtrending -She used to be discharged home with home health care physical therapy nursing and aides.  Confirmed and arranged or resumption of services as per case manager.  Case manager contacted the family on the day of discharge and notified. -Patient is to  follow quarantine for total of 14 days -Follow-up with PCP -Signs and symptoms when she must seek immediate medical attention instructions are given.  T2DM: Stable -May resume home medication  HTN  -continue Enalapril and Amlodipine   History of CVA  -No acute issue Residual left hemiparesis.  -continue Plavix, crestor and ASA   Cirrhosis with H/o Hep C  AST/ALT 87/71 -repeat CMET shows decreased in number while bilirubin is within normal limit -Follow-up outpatient  Confusion/behavioral changes: From admission some concern for cognitive and behavioral changes with possible dementia Work-up in progress by her primary care physician in collaboration with neurology as an outpatient Family is concerned about worsening memory recall Sister with diagnosis of Alzheimer's disease -Follow-up outpatient  Patient and family requests staying at [facility] then transferred to [facility]. Patient is currently requiring no more than 2 L supplemental oxygen which does not qualify her to be transferred to [facility].   Disposition:  Pt was living home with sister and receiving [place] full time. Spoke with Pt's niece, POA, [name]. They would like Pt to be discharged to home with [facility] resumption.  Spoke with niece on 03/22/19 evening. Answered to all her questions and addressed her concern about Pt not being on Abx. Expressed understanding.   Consults: None  Significant Diagnostic Studies: Imagings and blood work  Treatments: As per hospital course and discharge medication list  Discharge Exam: Blood pressure (!) 165/88, pulse 83, temperature 97.8 F (36.6 C), temperature source Oral, resp. rate 14, height 5' (1.524 m), weight 90.7 kg, SpO2 98 %.  HENT:  Head: Normocephalicand atraumatic.  Eyes:  Conjunctiva/sclera:  Conjunctivae normal.  Pupils: Pupils are equal, round, and reactive to light.  Neck:  Thyroid: No thyromegaly.  Trachea: No tracheal  deviation.  Cardiovascular:  Rate and Rhythm: Normal rateand regular rhythm.  Heart sounds: Normal heart sounds.  Pulmonary:  Effort: Pulmonary effort is normal. Norespiratory distress.  Breath sounds: Normal breath sounds. Nowheezing.  Chest:  Chest wall: No tenderness.  Abdominal:  General: Bowel sounds are normal. There is nodistension.  Palpations: Abdomen is soft.  Tenderness: There is no abdominal tenderness.  Musculoskeletal:  General: Normal range of motion.  Cervical back: Normal range of motionand neck supple.  Skin: General: Skin is warmand dry.  Findings: No rash.  Neurological:  Mental Status: Alert  Disposition: Discharge disposition: 01-Home or Self Care      Discharge Instructions    Bed rest   Complete by: As directed    Per PT   Call MD for:  difficulty breathing, headache or visual disturbances   Complete by: As directed    Call MD for:  extreme fatigue   Complete by: As directed    Call MD for:  persistant dizziness or light-headedness   Complete by: As directed    Call MD for:  temperature >100.4   Complete by: As directed    Diet - low sodium heart healthy   Complete by: As directed      Allergies as of 03/22/2019      Reactions   Sulfa Antibiotics Itching      Medication List    TAKE these medications   amLODipine 5 MG tablet Commonly known as: NORVASC Take 5 mg by mouth daily.   ascorbic acid 500 MG tablet Commonly known as: VITAMIN C Take 500 mg by mouth daily.   aspirin 81 MG EC tablet Take 81 mg by mouth daily.   B-complex with vitamin C tablet Take 1 tablet by mouth daily.   Cholecalciferol 50 MCG (2000 UT) Tabs Take 2,000 Units by mouth daily.   Cinnamon 500 MG capsule Take 500 mg by mouth 2 (two) times daily.   clopidogrel 75 MG tablet Commonly known as: PLAVIX Take 75 mg by mouth daily.   colchicine 0.6 MG tablet Take 0.6 mg by mouth daily.   dexamethasone 6 MG  tablet Commonly known as: DECADRON Take 1 tablet (6 mg total) by mouth daily.   diphenhydrAMINE 50 MG tablet Commonly known as: BENADRYL Take 50 mg by mouth at bedtime.   enalapril 10 MG tablet Commonly known as: VASOTEC Take 10 mg by mouth daily.   Fish Oil 1000 MG Caps Take 1,000 mg by mouth daily.   Flax Seed Oil 1000 MG Caps Take 1,000 mg by mouth daily.   folic acid 914 MCG tablet Commonly known as: FOLVITE Take 400 mcg by mouth daily.   glipiZIDE-metformin 2.5-500 MG tablet Commonly known as: METAGLIP Take 2 tablets by mouth 2 (two) times daily.   insulin aspart 100 UNIT/ML injection Commonly known as: novoLOG Inject 10 Units into the skin 3 (three) times daily with meals. Check BG TID   insulin glargine 100 UNIT/ML injection Commonly known as: LANTUS Inject 0.1 mLs (10 Units total) into the skin at bedtime.   Magnesium 200 MG Tabs Take 200 mg by mouth daily.   QC Tumeric Complex 500 MG Caps Generic drug: Turmeric Take 500 mg by mouth daily.   rosuvastatin 5 MG tablet Commonly known as: CRESTOR Take 5 mg by mouth daily.   Vimpat 50 MG Tabs tablet Generic drug:  lacosamide Take 50 mg by mouth 2 (two) times daily.        Signed: Thornell Mule 03/22/2019, 3:50 PM

## 2019-03-22 NOTE — Progress Notes (Addendum)
Patient yelling out incompressible words at staff this morning during care. Not sure if this is related to possible memory impairment or if patient is actually in pain. . NT reports that patient had a greenish colored incontinent urine (purewick was dislodged). Text page sent to on call provider with these findings and for possible ordering PRN tylenol. Patient is now resting quietly. Purewick in place and functioning properly. No irritation to vaginal or perineum. Awaiting response from on call provider. Will endorse to oncoming nurse.    Patient dose not have any pain medications ordered.

## 2019-03-22 NOTE — TOC Progression Note (Addendum)
Transition of Care [facility]) - Progression Note    Patient Details  Name: Carmen Olson MRN: [id number] Date of Birth: 04-Oct-1945  Transition of Care [facility]) CM/SW Contact  Ross Ludwig, [place] Phone Number: 03/22/2019, 6:42 PM  Clinical Narrative:    Patient's HCPOA called at 6:15pm and said that patient was walking independently at home before hospitalization, and now nurse reports she can not walk.  Patient's niece stated that if patient comes home she will have to have a hoyer lift.  Discharge was held pending PT evaluation tomorrow.  CSW requested that nurse ask physician for PT orders.  Per patient's HCPOA patient needs to be able to walk in order to return back home.  CSW informed her that patient will have to be evaluated by PT, and if PT recommends SNF, insurance will have to be approved, and [place] of [place] due to [place] positive.  CSW informed her that there are not any SNFs locally accepting Covid positive patients at this time.  Niece stated she would not want her to go out of county, [place] reminded her this would be the only option if there are beds available.  CSW informed her that home health can be set up, but they won't be there every day.  Patient's niece gave CSW permission to begin bed search, patient's niece requested [facility] for SNF placement even though there are not any SNFs accepting Covid patients locally.   Expected Discharge Plan: [place] [facility](CAPs program) Barriers to Discharge: Barriers Resolved  Expected Discharge Plan and Services Expected Discharge Plan: [place] [facility](CAPs program)     Post Acute Care Choice: Resumption of Svcs/PTA Provider Living arrangements for the past 2 months: Single Family Home Expected Discharge Date: 03/22/19                                     Social Determinants of Health (SDOH) Interventions    Readmission Risk Interventions No flowsheet data found.

## 2019-03-22 NOTE — Progress Notes (Signed)
[place] [facility] [place] NAME: Carmen Olson    MR#:  [id number]  DATE OF BIRTH:  10-21-1945  SUBJECTIVE:  No acute issues this morning.  No fever or worsening shortness of breath.  Chief Complaint  Patient presents with  . Shortness of Breath  Short of breath REVIEW OF SYSTEMS:  Review of Systems  Constitutional: Positive for malaise/fatigue. Negative for diaphoresis, fever and weight loss.  HENT: Negative for ear discharge, ear pain, hearing loss, nosebleeds, sore throat and tinnitus.   Eyes: Negative for blurred vision and pain.  Respiratory: Positive for shortness of breath. Negative for cough, hemoptysis and wheezing.   Cardiovascular: Negative for chest pain, palpitations, orthopnea and leg swelling.  Gastrointestinal: Negative for abdominal pain, blood in stool, constipation, diarrhea, heartburn, nausea and vomiting.  Genitourinary: Negative for dysuria, frequency and urgency.  Musculoskeletal: Negative for back pain and myalgias.  Skin: Negative for itching and rash.  Neurological: Negative for dizziness, tingling, tremors, focal weakness, seizures, weakness and headaches.  Psychiatric/Behavioral: Negative for depression. The patient is not nervous/anxious.    DRUG ALLERGIES:   Allergies  Allergen Reactions  . Sulfa Antibiotics Itching   VITALS:  Blood pressure (!) 155/113, pulse 79, temperature 98.7 F (37.1 C), temperature source Oral, resp. rate 14, height 5' (1.524 m), weight 90.7 kg, SpO2 98 %. PHYSICAL EXAMINATION:  PEx from 03/19/19   HENT:     Head: Normocephalic and atraumatic.  Eyes:     Conjunctiva/sclera: Conjunctivae normal.     Pupils: Pupils are equal, round, and reactive to light.  Neck:     Thyroid: No thyromegaly.     Trachea: No tracheal deviation.  Cardiovascular:     Rate and Rhythm: Normal rate and regular rhythm.     Heart sounds: Normal heart sounds.  Pulmonary:     Effort: Pulmonary effort is normal. No  respiratory distress.     Breath sounds: Normal breath sounds. No wheezing.  Chest:     Chest wall: No tenderness.  Abdominal:     General: Bowel sounds are normal. There is no distension.     Palpations: Abdomen is soft.     Tenderness: There is no abdominal tenderness.  Musculoskeletal:        General: Normal range of motion.     Cervical back: Normal range of motion and neck supple.  Skin:    General: Skin is warm and dry.     Findings: No rash.  Neurological:     Mental Status: Alert  LABORATORY PANEL:  Female CBC Recent Labs  Lab 03/22/19 0618  WBC 10.8*  HGB 13.4  HCT 42.0  PLT 299   ------------------------------------------------------------------------------------------------------------------ Chemistries  Recent Labs  Lab 03/22/19 0617  NA 143  K 3.9  CL 104  CO2 27  GLUCOSE 234*  BUN 29*  CREATININE 0.76  CALCIUM 8.7*  AST 40  ALT 50*  ALKPHOS 32*  BILITOT 0.6   RADIOLOGY:  No results found. ASSESSMENT AND PLAN:   [id number] with Hypoxia -this is a PUI Patient had a positive Covid result at [facility] on 12/3 per records.  Her PCR for Covid was negative in the emergency department. Another Covid test sent out blood on 12/11 came back positive. Was in desaturation to 88 with ambulation at the time of admission. -Currently maintaining O2 saturations on room air. -Admission CXR concerning for multifocal pneumonia particularly in setting of COVID+ result. PCT  negative. ESR 67. -Antibiotic was not started for bacterial pneumonia at present given negative procalcitonin and only minimally elevated WBC.  Chest x-ray is not also typical for Covid pneumonia  -Continue Decadron day 4 of 10 and remdesivir day 4 of 5 from admission -CRP 12.7  -Monitor D-dimer, FDP, LDH & CRP trending down  -low threshold to add antibiotics to treat for bacterial pneumonia if patient worsens --> No S/S so far  T2DM: Stable -hold metformin/glipizide given  acute illness  -CBGs 4x daily  -add SSI if glucose >200, will likely occur with steroid use   HTN   -continue Enalapril and Amlodipine   History of CVA  -No acute issue Residual left hemiparesis --> Unclear Pt's functional status at home - Will consult PT/OT  -continue Plavix, crestor and ASA   Cirrhosis with H/o Hep C  AST/ALT 87/71 -repeat CMET shows decreased in number while bilirubin is within normal limit  Confusion/behavioral changes: From admission some concern for cognitive and behavioral changes with possible dementia Work-up in progress by her primary care physician in collaboration with neurology as an outpatient Family is concerned about worsening memory recall Sister with diagnosis of Alzheimer's disease -Follow-up outpatient  Patient and family requests staying at [facility] then transferred to [facility]. Patient is currently requiring no more than 2 L supplemental oxygen which does not qualify her to be transferred to [facility].   All the records are reviewed and case discussed with Care Management/Social Worker. Management plans discussed with the patient, nursing and they are in agreement.  Disposition: Pt was to be discharged on 03/22/19. However, her niece thinks Pt's debility is worse. Unclear the baseline, however, given her Hx of CVA. Niece also mentioned if Pt can't ambulate, SNF is preferred to discharge over [place].  - Consulted PT/OT -> follow recs - CM to be notified  Even though earlier, Pt's niece mentioned, Pt was living home with sister and receiving [facility] nursing full time. Spoke with Pt's niece, POA, Carmen Olson. They would like Pt to be discharged to home with [facility] resumption.   CODE STATUS: Full Code  TOTAL TIME TAKING CARE OF THIS PATIENT: 35 minutes.   More than 50% of the time was spent in counseling/coordination of care: YES  POSSIBLE D/C IN 4-5 DAYS, DEPENDING ON CLINICAL CONDITION.   Thornell Mule M.D on 03/22/2019 at 7:33  PM  Between 7am to 6pm - Pager - [phone]  After 6pm go to [email or web address] - password [email or web address]  [facility]   CC: Primary care physician; Nathanial Millman, PA  Note: This dictation was prepared with Dragon dictation along with smaller phrase technology. Any transcriptional errors that result from this process are unintentional.

## 2019-03-22 NOTE — TOC Initial Note (Signed)
Transition of Care [facility]) - Initial/Assessment Note    Patient Details  Name: Carmen Olson MRN: [id number] Date of Birth: 06/28/1945  Transition of Care [facility]) CM/SW Contact:    Candie Chroman, LCSW Phone Number: 03/22/2019, 12:39 PM  Clinical Narrative:  Patient only oriented to self. CSW called patient's niece, introduced role, and explained that discharge planning would be discussed. Patient has CAPS services through [facility] [phone]). Patient lives there with her niece's mother-in-law (sister?) who is also hospitalized. Patient may need EMS transport home. CSW spoke with [facility] who stated that they will not be able to resume services until the day after discharge because Medicaid will not pay for two services in one day [facility] vs. Home services). Individual CSW spoke with will call niece to discuss family care on day of discharge and then they will resume around the clock care the next day. No further concerns. CSW encouraged patient's niece to contact CSW as needed. CSW will continue to follow patient and her niece for support and facilitate return home when stable.                Expected Discharge Plan: [place] [facility](CAPs program) Barriers to Discharge: Continued Medical Work up   Patient Goals and CMS Choice Patient states their goals for this hospitalization and ongoing recovery are:: Patient not fully oriented.   Choice offered to / list presented to : NA  Expected Discharge Plan and Services Expected Discharge Plan: [place] [facility](CAPs program)     Post Acute Care Choice: Resumption of Svcs/PTA Provider Living arrangements for the past 2 months: Single Family Home                                      Prior Living Arrangements/Services Living arrangements for the past 2 months: Single Family Home Lives with:: Siblings Patient language and need for interpreter reviewed:: Yes Do you feel safe going back to the place  where you live?: Yes      Need for Family Participation in Patient Care: Yes (Comment) Care giver support system in place?: Yes (comment) Current home services: DME, Other (comment)(CAPs program.) Criminal Activity/Legal Involvement Pertinent to Current Situation/Hospitalization: No - Comment as needed  Activities of Daily Living Home Assistive Devices/Equipment: Cane (specify quad or straight) ADL Screening (condition at time of admission) Patient's cognitive ability adequate to safely complete daily activities?: No Is the patient deaf or have difficulty hearing?: No Does the patient have difficulty seeing, even when wearing glasses/contacts?: No Does the patient have difficulty concentrating, remembering, or making decisions?: Yes Patient able to express need for assistance with ADLs?: No Does the patient have difficulty dressing or bathing?: Yes Independently performs ADLs?: No Communication: Independent Dressing (OT): Needs assistance Is this a change from baseline?: Pre-admission baseline Grooming: Needs assistance, Dependent Is this a change from baseline?: Pre-admission baseline Feeding: Dependent Is this a change from baseline?: Pre-admission baseline Bathing: Dependent Is this a change from baseline?: Pre-admission baseline Toileting: Dependent Is this a change from baseline?: Pre-admission baseline In/Out Bed: Needs assistance, Dependent Is this a change from baseline?: Pre-admission baseline Walks in Home: Independent with device (comment)(cane) Does the patient have difficulty walking or climbing stairs?: Yes Weakness of Legs: Both Weakness of Arms/Hands: Both  Permission Sought/Granted Permission sought to share information with : Family Supports    Share Information with NAME: Tassie Pollett  Permission granted to share info w AGENCY: [facility]  Permission granted to share info w Relationship: Niece  Permission granted to share info w Contact Information:  [phone]  Emotional Assessment Appearance:: Appears stated age Attitude/Demeanor/Rapport: Unable to Assess Affect (typically observed): Unable to Assess Orientation: : Oriented to Self Alcohol / Substance Use: Not Applicable Psych Involvement: No (comment)  Admission diagnosis:  Weakness [R53.1] Hypoxia [R09.02] COVID-19 [U07.1] Patient Active Problem List   Diagnosis Date Noted  . Pressure injury of skin 03/19/2019  . COVID-19 03/18/2019  . Hypoxia 03/18/2019  . Essential hypertension 09/14/2018  . Recurrent UTI (urinary tract infection) 04/29/2017  . Type 2 diabetes mellitus without complication, without long-term current use of insulin (HCC) 03/13/2017  . Hx of hepatitis C 10/24/2016  . Alcoholic cirrhosis of liver without ascites (HCC) 01/11/2016  . CVA, old, hemiparesis (HCC) 01/11/2016   PCP:  Leslye Peer, PA Pharmacy:   [phone] [place], [place] - [address] [place] [address] Phone: [phone] Fax: [phone]     Social Determinants of Health (SDOH) Interventions    Readmission Risk Interventions No flowsheet data found.

## 2019-03-22 NOTE — Care Management Important Message (Signed)
Important Message  Patient Details  Name: Carmen Olson MRN: [id number] Date of Birth: [birth year]   Medicare Important Message Given:  Yes  Initial Medicare IM given by Patient Access Associate on 03/21/2019 at 1506.  Still valid.    Dannette Barbara 03/22/2019, 9:40 AM

## 2019-03-22 NOTE — Progress Notes (Addendum)
HCPOA just called and is asking why patient is not ambulating as she normally walks at home. PT has not consulted this PT, following up with MD  1820: Per MD PT consult tomorrow to determine home vs SNF, family contacted and updated that patient will not  d/c tonight

## 2019-03-23 LAB — GLUCOSE, CAPILLARY
Glucose-Capillary: 228 mg/dL — ABNORMAL HIGH (ref 70–99)
Glucose-Capillary: 188 mg/dL — ABNORMAL HIGH (ref 70–99)
Glucose-Capillary: 168 mg/dL — ABNORMAL HIGH (ref 70–99)
Glucose-Capillary: 214 mg/dL — ABNORMAL HIGH (ref 70–99)
Glucose-Capillary: 215 mg/dL — ABNORMAL HIGH (ref 70–99)

## 2019-03-23 LAB — COMPREHENSIVE METABOLIC PANEL
ALT: 63 U/L — ABNORMAL HIGH (ref 0–44)
AST: 55 U/L — ABNORMAL HIGH (ref 15–41)
Albumin: 3.5 g/dL (ref 3.5–5.0)
Alkaline Phosphatase: 38 U/L (ref 38–126)
Anion gap: 13 (ref 5–15)
BUN: 20 mg/dL (ref 8–23)
CO2: 27 mmol/L (ref 22–32)
Calcium: 9 mg/dL (ref 8.9–10.3)
Chloride: 99 mmol/L (ref 98–111)
Creatinine, Ser: 0.6 mg/dL (ref 0.44–1.00)
GFR calc Af Amer: >60 mL/min (ref >60)
GFR calc non Af Amer: >60 mL/min (ref >60)
Glucose, Bld: 202 mg/dL — ABNORMAL HIGH (ref 70–99)
Potassium: 4 mmol/L (ref 3.5–5.1)
Sodium: 139 mmol/L (ref 135–145)
Total Bilirubin: 0.8 mg/dL (ref 0.3–1.2)
Total Protein: 7.3 g/dL (ref 6.5–8.1)

## 2019-03-23 LAB — URINALYSIS, COMPLETE (UACMP) WITH MICROSCOPIC
Bilirubin Urine: NEGATIVE
Glucose, UA: 50 mg/dL — AB
Hgb urine dipstick: NEGATIVE
Ketones, ur: NEGATIVE mg/dL
Leukocytes,Ua: NEGATIVE
Nitrite: NEGATIVE
Protein, ur: NEGATIVE mg/dL
Specific Gravity, Urine: 1.018 (ref 1.005–1.030)
pH: 7 (ref 5.0–8.0)

## 2019-03-23 MED ORDER — ENOXAPARIN SODIUM 40 MG/0.4ML ~~LOC~~ SOLN
40.0000 mg | Freq: Two times a day (BID) | SUBCUTANEOUS | Status: DC
  Administered 2019-03-23 – 2019-03-25 (×5): 40 mg via SUBCUTANEOUS
  Filled 2019-03-23 (×5): qty 0.4

## 2019-03-23 NOTE — NC FL2 (Signed)
[place] LEVEL OF CARE SCREENING TOOL     IDENTIFICATION  Patient Name: Carmen Olson Birthdate: 04/27/1945 Sex: female Admission Date (Current Location): 03/18/2019  [place] and [place] Number:  Engineering geologist and Address:  [facility], [address], [place], [place] [address]      Provider Number: [id number]  Attending Physician Name and Address:  Thornell Mule, MD  Relative Name and Phone Number:       Current Level of Care: Hospital Recommended Level of Care: [place] Prior Approval Number:    Date Approved/Denied:   PASRR Number: [id number]  Discharge Plan: SNF    Current Diagnoses: Patient Active Problem List   Diagnosis Date Noted  . Pressure injury of skin 03/19/2019  . COVID-19 03/18/2019  . Hypoxia 03/18/2019  . Essential hypertension 09/14/2018  . Recurrent UTI (urinary tract infection) 04/29/2017  . Type 2 diabetes mellitus without complication, without long-term current use of insulin ([place]) 03/13/2017  . Hx of hepatitis C 10/24/2016  . Alcoholic cirrhosis of liver without ascites ([place]) 01/11/2016  . CVA, old, hemiparesis ([place]) 01/11/2016    Orientation RESPIRATION BLADDER Height & Weight     Self, Place  Normal Incontinent, External catheter Weight: 165 lb 3.2 oz (74.9 kg) Height:  5' (152.4 cm)  BEHAVIORAL SYMPTOMS/MOOD NEUROLOGICAL BOWEL NUTRITION STATUS  (None) (CVA, old, hemiparesis) Incontinent Diet(Heart healthy/carb modified)  AMBULATORY STATUS COMMUNICATION OF NEEDS Skin   Extensive Assist Verbally Other (Comment), PU Stage and Appropriate Care(MASD.)   PU Stage 2 Dressing: (Mid sacrum: Foam (lift to assess every shift) every 3 days.)                   Personal Care Assistance Level of Assistance  Bathing, Feeding, Dressing Bathing Assistance: Maximum assistance Feeding assistance: Limited assistance Dressing Assistance: Maximum assistance     Functional Limitations  Info  Sight, Hearing, Speech Sight Info: Adequate Hearing Info: Adequate Speech Info: Adequate    SPECIAL CARE FACTORS FREQUENCY  PT (By licensed PT), OT (By licensed OT)     PT Frequency: 5 x week OT Frequency: 5 x week            Contractures Contractures Info: Present    Additional Factors Info  Code Status, Allergies Code Status Info: Full code Allergies Info: Sulfa Antibiotics           Current Medications (03/23/2019):  This is the current hospital active medication list Current Facility-Administered Medications  Medication Dose Route Frequency Provider Last Rate Last Admin  . amLODipine (NORVASC) tablet 5 mg  5 mg Oral Daily Nicolette Bang, DO   5 mg at 03/23/19 0855  . aspirin EC tablet 81 mg  81 mg Oral Daily Nicolette Bang, DO   81 mg at 03/23/19 0855  . B-complex with vitamin C tablet 1 tablet  1 tablet Oral Daily Max Sane, MD   1 tablet at 03/23/19 0854  . cholecalciferol (VITAMIN D) tablet 2,000 Units  2,000 Units Oral Daily Max Sane, MD   2,000 Units at 03/23/19 0854  . clopidogrel (PLAVIX) tablet 75 mg  75 mg Oral Daily Nicolette Bang, DO   75 mg at 03/23/19 0857  . colchicine tablet 0.6 mg  0.6 mg Oral Daily Nicolette Bang, DO   0.6 mg at 03/23/19 7425  . dexamethasone (DECADRON) injection 6 mg  6 mg Intramuscular Q24H Thornell Mule, MD   6 mg at 03/23/19 0902  . enalapril (  VASOTEC) tablet 10 mg  10 mg Oral Daily Arvilla Market, DO   10 mg at 03/23/19 3295  . enoxaparin (LOVENOX) injection 40 mg  40 mg Subcutaneous Q12H Thomasenia Bottoms, MD      . folic acid (FOLVITE) tablet 0.5 mg  0.5 mg Oral Daily Delfino Lovett, MD   0.5 mg at 03/23/19 0856  . insulin aspart (novoLOG) injection 0-15 Units  0-15 Units Subcutaneous TID WC Thomasenia Bottoms, MD   3 Units at 03/23/19 1210  . insulin aspart (novoLOG) injection 10 Units  10 Units Subcutaneous TID WC Thomasenia Bottoms, MD   10 Units at 03/23/19 1210  . insulin  glargine (LANTUS) injection 8 Units  8 Units Subcutaneous QHS Thomasenia Bottoms, MD   8 Units at 03/22/19 2202  . lacosamide (VIMPAT) tablet 50 mg  50 mg Oral BID Arvilla Market, DO   50 mg at 03/23/19 1884  . magnesium oxide (MAG-OX) tablet 200 mg  200 mg Oral Daily Delfino Lovett, MD   200 mg at 03/23/19 0856  . rosuvastatin (CRESTOR) tablet 5 mg  5 mg Oral Daily Arvilla Market, DO   5 mg at 03/23/19 0855  . sodium chloride flush (NS) 0.9 % injection 10-40 mL  10-40 mL Intracatheter PRN Arvilla Market, DO   10 mL at 03/19/19 2233  . vitamin C (ASCORBIC ACID) tablet 500 mg  500 mg Oral Daily Delfino Lovett, MD   500 mg at 03/23/19 1660     Discharge Medications: Please see discharge summary for a list of discharge medications.  Relevant Imaging Results:  Relevant Lab Results:   Additional Information SS#: [id number]  Margarito Liner, LCSW

## 2019-03-23 NOTE — Evaluation (Signed)
Physical Therapy Evaluation Patient Details Name: Carmen Olson MRN: [id number] DOB: 11-25-1945 Today's Date: 03/23/2019   History of Present Illness  presented to ER secondary to AMS, fever; admitted for management of multifocal PNA due to COVID-19 virus.  Clinical Impression  Patient sleeping upon arrival to room, but easily arousable to voice and light touch.  Agreeable to participation with session with min encouragement from therapist.  Patient oriented to self only; inconsistently follows simple commands, performing best with hand-over-hand assist and demonstration from therapist.  Speech very labored and effortful with noted delay in task initiation/processing.  Baseline L UE > LE hemiparesis evident with noted tone to L UE flex/extensors.  Currently requiring mod assist for bed mobility; heavy mod assist +1 for sit/stand, standing balance and bed/chair transfer.  Did attempt RW, but patient with difficulty maintaining L UE placement/grasp on RW (tends to move towards decerebrate posturing in upright positions).  Requiring dep assist for L LE advancement and position with all transfer/mobility tasks, but does support weight without buckling.  Anticipate optimal performance with SPC or HHA in future efforts. Will plan to further assess/progress gait abilities as appropriate. Vitals stable and WFL throughout; minimal/no SOB with exertional activities during session. Would benefit from skilled PT to address above deficits and promote optimal return to PLOF.; recommend transition to STR upon discharge from acute hospitalization.     Follow Up Recommendations SNF    Equipment Recommendations       Recommendations for Other Services       Precautions / Restrictions Precautions Precautions: Fall Restrictions Weight Bearing Restrictions: No      Mobility  Bed Mobility Overal bed mobility: Needs Assistance Bed Mobility: Supine to Sit     Supine to sit: Mod assist         Transfers Overall transfer level: Needs assistance Equipment used: Rolling walker (2 wheeled) Transfers: Sit to/from Stand Sit to Stand: Mod assist;Max assist         General transfer comment: hand-over-hand for L UE placement (with difficulty maintaining placement/grasp on RW); extensive assist for position, weight shift and task initiation.  Ambulation/Gait Ambulation/Gait assistance: Mod assist Gait Distance (Feet): 5 Feet Assistive device: Rolling walker (2 wheeled)       General Gait Details: dep assist for advancement and position of L LE; unable to maintain L UE on RW.  Supports weight in LEs without buckling, but unsafe to maintain balance without hands-on assist at all times.  Stairs            Wheelchair Mobility    Modified Rankin (Stroke Patients Only)       Balance Overall balance assessment: Needs assistance Sitting-balance support: No upper extremity supported;Feet supported Sitting balance-Leahy Scale: Fair     Standing balance support: Bilateral upper extremity supported Standing balance-Leahy Scale: Poor                               Pertinent Vitals/Pain Pain Assessment: Faces Faces Pain Scale: No hurt    Home Living                   Additional Comments: Patient unreliable historian.  Per chart (and CSW), was living with family member and had hired aide for assist in home environment    Prior Function           Comments: Patient unreliable historian.  Per chart, ambulatory (patient does indicate SPC) at baseline.  Hand Dominance   Dominant Hand: Right    Extremity/Trunk Assessment   Upper Extremity Assessment Upper Extremity Assessment: Generalized weakness(grossly 3-/5 throughout R UE, 2+ to 3-/5 throughout L UE; significant tone (2/4) L elbow flex/ext)    Lower Extremity Assessment Lower Extremity Assessment: Generalized weakness(grossly 3-/5 throughout bilat LEs)       Communication    Communication: (speech very effortful and deliberate, non-fluent; delayed initiation and processing)  Cognition Arousal/Alertness: Awake/alert Behavior During Therapy: Flat affect Overall Cognitive Status: Difficult to assess                                 General Comments: oriented to self only; follows approx 50% simple verbal commands with noted delay in processing and initiation      General Comments      Exercises Other Exercises Other Exercises: Sit/stand x3 with RW, mod/max progressing to mod assist with repetition--hand-over-hand for L UE placement (with difficulty maintaining placement/grasp on RW); extensive assist for position, weight shift and task initiation.  L UE tends to move towards decerebrate posturing with upright/standing position Other Exercises: Lateral stepping edge of bed, mod/max assist for L LE position/movement and overall balance   Assessment/Plan    PT Assessment Patient needs continued PT services  PT Problem List Decreased strength;Decreased range of motion;Decreased activity tolerance;Decreased balance;Decreased mobility;Decreased coordination;Decreased cognition;Decreased knowledge of use of DME;Decreased safety awareness;Decreased knowledge of precautions;Obesity       PT Treatment Interventions DME instruction;Gait training;Functional mobility training;Therapeutic activities;Therapeutic exercise;Balance training;Neuromuscular re-education;Cognitive remediation;Patient/family education    PT Goals (Current goals can be found in the Care Plan section)  Acute Rehab PT Goals Patient Stated Goal: agreeable to session, goal of OOB to chair PT Goal Formulation: With patient Time For Goal Achievement: 04/06/19 Potential to Achieve Goals: Fair    Frequency Min 2X/week   Barriers to discharge Decreased caregiver support      Co-evaluation               AM-PAC PT "6 Clicks" Mobility  Outcome Measure Help needed turning from your  back to your side while in a flat bed without using bedrails?: A Lot Help needed moving from lying on your back to sitting on the side of a flat bed without using bedrails?: A Lot Help needed moving to and from a bed to a chair (including a wheelchair)?: A Lot Help needed standing up from a chair using your arms (e.g., wheelchair or bedside chair)?: A Lot Help needed to walk in hospital room?: A Lot Help needed climbing 3-5 steps with a railing? : Total 6 Click Score: 11    End of Session   Activity Tolerance: Patient tolerated treatment well Patient left: in chair;with call bell/phone within reach;with chair alarm set Nurse Communication: Mobility status PT Visit Diagnosis: Muscle weakness (generalized) (M62.81);Difficulty in walking, not elsewhere classified (R26.2)    Time: 1050-1130 PT Time Calculation (min) (ACUTE ONLY): 40 min   Charges:   PT Evaluation $PT Eval Moderate Complexity: 1 Mod PT Treatments $Therapeutic Activity: 23-37 mins        Coraleigh Sheeran H. Owens Shark, PT, DPT, NCS 03/23/19, 2:01 PM [phone]

## 2019-03-23 NOTE — Consult Note (Signed)
PHARMACIST - PHYSICIAN COMMUNICATION  CONCERNING:  Enoxaparin (Lovenox) for DVT Prophylaxis    RECOMMENDATION: Patient was prescribed enoxaprin 0.5mg /kg q24 hours for VTE prophylaxis.   Filed Weights   03/18/19 1433 03/23/19 0328  Weight: 200 lb (90.7 kg) 165 lb 3.2 oz (74.9 kg)    Body mass index is 32.26 kg/m.  Estimated Creatinine Clearance: 57.5 mL/min (by C-G formula based on SCr of 0.6 mg/dL).   Based on [facility] DVT ppx protocol in COVID patients.  Patient is candidate for enoxaparin 40mg  every 12 hour dosing due d-dimer elevated > 5000 and pt is COVID positive.  DESCRIPTION: Pharmacy has adjusted enoxaparin dose per [facility] policy.  Patient is now receiving enoxaparin 40mg  every 12 hours.   Oswald Hillock, PharmD, BCPS Clinical Pharmacist  03/23/2019 12:02 PM

## 2019-03-23 NOTE — Progress Notes (Signed)
[facility] [place] NAME: Carmen Olson    MR#:  [id number]  DATE OF BIRTH:  [birth year]  SUBJECTIVE:  No acute issues this morning.  No fever or worsening shortness of breath.  Chief Complaint  Patient presents with  . Shortness of Breath  Short of breath REVIEW OF SYSTEMS:  Pt doesn't answer to all questions and incoherent  DRUG ALLERGIES:   Allergies  Allergen Reactions  . Sulfa Antibiotics Itching   VITALS:  Blood pressure (!) 152/86, pulse 97, temperature 98.2 F (36.8 C), temperature source Oral, resp. rate 19, height 5' (1.524 m), weight 74.9 kg, SpO2 97 %. PHYSICAL EXAMINATION:   HENT:     Head: Normocephalic and atraumatic.  Eyes:     Conjunctiva/sclera: Conjunctivae normal.     Pupils: Pupils are equal, round, and reactive to light.  Neck:     Thyroid: No thyromegaly.     Trachea: No tracheal deviation.  Cardiovascular:     Rate and Rhythm: Normal rate and regular rhythm.     Heart sounds: Normal heart sounds.  Pulmonary:     Effort: Pulmonary effort is normal. No respiratory distress.     Breath sounds: Normal breath sounds. No wheezing.  Chest:     Chest wall: No tenderness.  Abdominal:     General: Bowel sounds are normal. There is no distension.     Palpations: Abdomen is soft.     Tenderness: There is no abdominal tenderness.  Musculoskeletal:        General: Normal range of motion.     Cervical back: Normal range of motion and neck supple.  Skin:    General: Skin is warm and dry.     Findings: No rash.  Neurological:     Mental Status: Alert  LABORATORY PANEL:  Female CBC Recent Labs  Lab 03/22/19 0618  WBC 10.8*  HGB 13.4  HCT 42.0  PLT 299   ------------------------------------------------------------------------------------------------------------------ Chemistries  Recent Labs  Lab 03/23/19 0526  NA 139  K 4.0  CL 99  CO2 27  GLUCOSE 202*  BUN 20  CREATININE 0.60  CALCIUM 9.0  AST 55*    ALT 63*  ALKPHOS 38  BILITOT 0.8   RADIOLOGY:  No results found. ASSESSMENT AND PLAN:   [id number] with Hypoxia  Patient had a positive Covid result at [facility] on 12/3 per records.  Her PCR for Covid was negative in the emergency department. Another Covid test sent out blood on 12/11 came back positive. Was in desaturation to 88 with ambulation at the time of admission. -Currently maintaining O2 saturations on room air. -Admission CXR concerning for multifocal pneumonia particularly in setting of COVID+ result. PCT negative. ESR 67. -Antibiotic was not started for bacterial pneumonia at present given negative procalcitonin and only minimally elevated WBC.  Chest x-ray is not also typical for Covid pneumonia  -Continue Decadron day 4 of 10 and remdesivir day 4 of 5 from admission -CRP 12.7  -Monitor D-dimer, FDP, LDH & CRP trending down  -So far no fever or WBC elevation -low threshold to add antibiotics to treat for bacterial pneumonia if patient worsens --> No S/S so far  T2DM: Stable -hold metformin/glipizide given acute illness  -CBGs 4x daily  -add SSI if glucose > discharge. -continue Plavix, crestor and ASA   Cirrhosis with H/o Hep C  AST/ALT 87/71 -repeat CMET shows decreased in number while bilirubin is  within normal limit  Confusion/behavioral changes: From admission some concern for cognitive and behavioral changes with possible dementia Work-up in progress by her primary care physician in collaboration with neurology as an outpatient Family is concerned about worsening memory recall Sister with diagnosis of Alzheimer's disease -Follow-up outpatient -Culture showed contamination here.    Patient and family requests staying at [facility] then transferred to [facility]. Patient is currently requiring no more than 2 L supplemental oxygen which does not qualify her to be transferred to [facility].   All the records are reviewed and  case discussed with Care Management/Social Worker. Management plans discussed with the patient, nursing and they are in agreement.  Disposition: Pt was to be discharged on 03/22/19. However, her niece thinks Pt's debility is worse. Unclear the baseline, however, given her Hx of CVA. Niece also mentioned if Pt can't ambulate, SNF is preferred to discharge over H w HHC.  - Consulted PT/OT -> recommended skilled nursing facility - CM to be notified working on bed Even though earlier, Pt's niece mentioned, Pt was living home with sister and receiving [place] nursing full time. Spoke with Pt's niece, POA, Carmen Olson. They would like Pt to be discharged to home with [facility] resumption.   CODE STATUS: Full Code  TOTAL TIME TAKING CARE OF THIS PATIENT: 35 minutes.   More than 50% of the time was spent in counseling/coordination of care: YES  POSSIBLE D/C IN 4-5 DAYS, DEPENDING ON CLINICAL CONDITION.   Thornell Mule M.D on 03/23/2019 at 2:43 PM  Between 7am to 6pm - Pager - [phone]  After 6pm go to [email or web address] - password [email or web address]  [facility]   CC: Primary care physician; Nathanial Millman, PA  Note: This dictation was prepared with Dragon dictation along with smaller phrase technology. Any transcriptional errors that result from this process are unintentional.

## 2019-03-23 NOTE — Progress Notes (Signed)
Per care management notes, plan was for patient to go to SNF.  This RN spoke to niece [name] this evening and she states that they do not want her to go to SNF but back home.  This RN sent message to MD and social worker to make them aware.

## 2019-03-24 LAB — GLUCOSE, CAPILLARY
Glucose-Capillary: 149 mg/dL — ABNORMAL HIGH (ref 70–99)
Glucose-Capillary: 272 mg/dL — ABNORMAL HIGH (ref 70–99)
Glucose-Capillary: 194 mg/dL — ABNORMAL HIGH (ref 70–99)
Glucose-Capillary: 217 mg/dL — ABNORMAL HIGH (ref 70–99)

## 2019-03-24 MED ORDER — DEXAMETHASONE SODIUM PHOSPHATE 4 MG/ML IJ SOLN
6.0000 mg | Freq: Every day | INTRAMUSCULAR | Status: DC
  Administered 2019-03-24 – 2019-03-25 (×2): 6 mg via INTRAVENOUS
  Filled 2019-03-24 (×2): qty 1.5

## 2019-03-24 NOTE — Evaluation (Signed)
Occupational Therapy Evaluation Patient Details Name: Carmen Olson MRN: [id number] DOB: 02-24-1946 Today's Date: 03/24/2019    History of Present Illness presented to ER secondary to AMS, fever; admitted for management of multifocal PNA due to COVID-19 virus.   Clinical Impression   Pt seen for OT evaluation this date. Information regarding PLOF and home set-up limited to chart review as pt is unreliable historian, and unable to answer A&O questions this date. Pt oriented to self and place only t/o evaluation. Per chart, pt lives with relatives and uses a [facility] for functional mobility at baseline.  Currently pt demonstrates impairments listed below (See OT problem list) which functionally limit her ability to perform self care/ADL tasks.  Pt would benefit from skilled OT services to address noted impairments and functional limitations (see below for any additional details) in order to maximize safety and independence while minimizing falls risk and caregiver burden.  Upon hospital discharge, recommend STR upon hospital DC to maximize pt safety and return to PLOF.     Follow Up Recommendations  SNF;Supervision/Assistance - 24 hour    Equipment Recommendations  3 in 1 bedside commode    Recommendations for Other Services       Precautions / Restrictions Precautions Precautions: Fall Restrictions Weight Bearing Restrictions: No      Mobility Bed Mobility Overal bed mobility: Needs Assistance Bed Mobility: Supine to Sit;Sit to Supine     Supine to sit: Mod assist Sit to supine: Max assist      Transfers Overall transfer level: Needs assistance Equipment used: Rolling walker (2 wheeled)   Sit to Stand: Mod assist;Max assist              Balance Overall balance assessment: Needs assistance Sitting-balance support: No upper extremity supported;Feet supported Sitting balance-Leahy Scale: Fair Sitting balance - Comments: Pt sits EOB with at least 1UE support, feet  unsupported. Is unable to maintain seated balance w/o 1UE.   Standing balance support: Bilateral upper extremity supported Standing balance-Leahy Scale: Poor                             ADL either performed or assessed with clinical judgement   ADL Overall ADL's : Needs assistance/impaired                                       General ADL Comments: Pt is functionally limited by cognitive status and generalized weakness with LUE>RUE. Pt requires min A for UB ADL tasks including self-feeding, grooming, and UB bathing. Mod-max assit for LB ADL tasks.     Vision Patient Visual Report: No change from baseline       Perception     Praxis      Pertinent Vitals/Pain Pain Assessment: No/denies pain Faces Pain Scale: Hurts little more Pain Location: Low back Pain Intervention(s): Monitored during session;Repositioned;Patient requesting pain meds-RN notified     Hand Dominance Right   Extremity/Trunk Assessment Upper Extremity Assessment Upper Extremity Assessment: Generalized weakness(grossly 3-/5 throughout R UE, 2+ to 3-/5 throughout L UE;  significant tone, L elbow flex/ext, Pt able to complete composite flexion/enxtension of all digits, but pt noted to have significant decrease in [facility] with LUE>RUE)   Lower Extremity Assessment Lower Extremity Assessment: Generalized weakness;Defer to PT evaluation       Communication Communication Communication: Expressive difficulties(Speech effortful, delayed, and difficult to understand  at times.)   Cognition Arousal/Alertness: Awake/alert Behavior During Therapy: Flat affect Overall Cognitive Status: No family/caregiver present to determine baseline cognitive functioning                                 General Comments: oriented to self only; follows approx 50% simple verbal commands with noted delay in processing and initiation   General Comments       Exercises Other Exercises Other  Exercises: Sup<>sit transfer to sitting EOB. OT attempts to engage pt in sitting balance activity, but pt is unable to maintain sitting balance at EOB. OT assists pt with return to bed this date.   Shoulder Instructions      Home Living Family/patient expects to be discharged to:: Unsure Living Arrangements: Other relatives                               Additional Comments: Patient unreliable historian.  Per chart (and CSW), was living with family member and had hired aide for assist in home environment      Prior Functioning/Environment          Comments: Patient unreliable historian.  Per chart, ambulatory (patient does indicate SPC) at baseline.        OT Problem List: Decreased strength;Decreased coordination;Pain;Decreased range of motion;Decreased activity tolerance;Decreased safety awareness;Impaired balance (sitting and/or standing);Decreased knowledge of use of DME or AE;Impaired UE functional use      OT Treatment/Interventions: Self-care/ADL training;Therapeutic exercise;Therapeutic activities;DME and/or AE instruction;Patient/family education;Balance training    OT Goals(Current goals can be found in the care plan section) Acute Rehab OT Goals Patient Stated Goal: To go home OT Goal Formulation: With patient Time For Goal Achievement: 04/07/19 Potential to Achieve Goals: Good ADL Goals Pt Will Perform Grooming: with modified independence;sitting;with adaptive equipment(With LRAD PRN for improved safety and functional independence) Pt Will Perform Upper Body Bathing: sitting;with min assist;with adaptive equipment Pt Will Transfer to Toilet: bedside commode;stand pivot transfer;with min assist(With LRAD PRN for safety) Pt Will Perform Toileting - Clothing Manipulation and hygiene: sit to/from stand;with min assist;with adaptive equipment(With LRAD PRN for safety)  OT Frequency:     Barriers to D/C:            Co-evaluation               AM-PAC OT "6 Clicks" Daily Activity     Outcome Measure Help from another person eating meals?: A Little Help from another person taking care of personal grooming?: A Little Help from another person toileting, which includes using toliet, bedpan, or urinal?: A Lot Help from another person bathing (including washing, rinsing, drying)?: A Lot Help from another person to put on and taking off regular upper body clothing?: A Little Help from another person to put on and taking off regular lower body clothing?: A Lot 6 Click Score: 15   End of Session Nurse Communication: Other (comment)(Pt moaning, stating her low back hurt.)  Activity Tolerance: Patient limited by fatigue;Patient limited by pain Patient left: in bed;with call bell/phone within reach;with bed alarm set  OT Visit Diagnosis: Other abnormalities of gait and mobility (R26.89);Muscle weakness (generalized) (M62.81)                Time: 8101-7510 OT Time Calculation (min): 24 min Charges:  OT Evaluation $OT Eval Moderate Complexity: 1 Mod OT Treatments $Self Care/Home Management :  8-22 mins  Rockney GheeSerenity Jaylynn Siefert, M.S., OTR/L Ascom: [phone] 03/24/19, 1:47 PM

## 2019-03-24 NOTE — Progress Notes (Signed)
PROGRESS NOTE    Carmen Olson  EXH:[id number] DOB: 07-02-1945 DOA: 03/18/2019 PCP: Nathanial Millman, PA      Brief Narrative:  Carmen Olson is a 73 y.o. F with hx CVA with left hemiparesis, DM, HTN lives at home, walks with cane, no previous dementia, HTN, DM and compensated cirrhosis who presented with SOB, fever for 1 week.  In the ER, CXR with multifocal pneumonia.  COVID+.  SpO2 88% on room air.        Assessment & Plan:  Coronavirus pneumonitis with acute hypoxic respiratory failure In the context of the 2020 COVID-19 pandemic. Completed 5 days remdesivir.  Inflammatory markers improving Now weaned off O2.  -Continue steroids, day 6 -Continue incentive spirometer, flutter valve -Nutrition consult -Continue vitamin C and zinc -Trend LFTs on remdesivir   Acute metabolic encephalopathy From COVID. -Check Ammonia given hx cirrhosis -PT/OT -Delirium precautions  Cerebrovascular disease secondary prevention Hypertension Blood pressure controlled -Continue amlodipine, enalapril -Continue Plavix, Crestor, aspirin  Diabetes Glucoses elevated -Continue Lantus -Continue mealtime insulin -Continue SS correction insulin  -Hold glipizide and metformin  Stage II pressure injury sacrum, POA   Gout -Continue colchicine  Other medicaitons -Continue Vimpat, unclear indication    Cirrhosis Compensated -Trend LFTs on remdesivir      MDM and disposition: The below labs and imaging reports were reviewed and summarized above.  Medication management as above.  The patient was admitted with COVID, now waened off O2 but persistently encephalopathic.  At baseline, the patient is independent, feeds herself, ambulates with a cane, and is interactive, with only the beginning suspicions of dementia by her nephew and niece.  At present, she is oriented only to self, requires assistance to sit at the edge of the bed for 5 minutes, and is unable to ambulate, as well  as needs help with feeding.  She is still encephalopathic, will likely need considerable rehabilitation until she is able to return to her prior level of function.       DVT prophylaxis: Lovenox Code Status: Full code Family Communication: Niece and nephew/POA by phone    Consultants:     Procedures:     Antimicrobials:       Subjective: No new fever.  Patient is still confused, very weak.  No focal weakness, vomiting, diarrhea, cough, respiratory distress.  Objective: Vitals:   03/23/19 2000 03/24/19 0349 03/24/19 0815 03/24/19 1537  BP: 138/89 134/76 (!) 145/94 109/90  Pulse: 85 89 92 81  Resp:   19 18  Temp: 97.6 F (36.4 C) (!) 97.5 F (36.4 C) 98.3 F (36.8 C) 97.7 F (36.5 C)  TempSrc: Oral Oral Oral Oral  SpO2: 97% 94%  98%  Weight:  74.5 kg    Height:        Intake/Output Summary (Last 24 hours) at 03/24/2019 1538 Last data filed at 03/24/2019 1000 Gross per 24 hour  Intake 10 ml  Output 300 ml  Net -290 ml   Filed Weights   03/18/19 1433 03/23/19 0328 03/24/19 0349  Weight: 90.7 kg 74.9 kg 74.5 kg    Examination: General appearance: Obese, chronically ill-appearing adult female, awake alert fatigued, slow in her responses, no obvious distress.   HEENT: Anicteric, conjunctiva pink, lids and lashes normal. No nasal deformity, discharge, epistaxis.  Lips moist, dentition poor, oropharynx tacky dry, no oral lesions, hearing normal.   Skin: Warm and dry.   No suspicious rashes or lesions. Cardiac: RRR, nl S1-S2, no murmurs appreciated.  Capillary refill is  brisk.  JVP not visible.  no LE edema.  Radial pulses 2+ and symmetric. Respiratory: Normal respiratory rate and rhythm.  CTAB without rales or wheezes. Abdomen: Abdomen soft.  No TTP or guarding. No ascites, distension, hepatosplenomegaly.   MSK: No deformities or effusions.  Contractures of the right hand, appear chronic. Neuro: Awake and alert.  EOMI, moves left upper extremity more weak in  left lower extremity, more weak than right extremities.  But generalized weakness is noted, needs assistance sitting up in bed. Speech fluent.    Psych: Psychomotor slowing noted.  Affect blunted.  Judgment and insight appear severely impaired.  Oriented to self only.      Data Reviewed: I have personally reviewed following labs and imaging studies:  CBC: Recent Labs  Lab 03/18/19 1435 03/19/19 0936 03/20/19 0257 03/22/19 0618  WBC 11.5*  11.6* 10.4 7.5 10.8*  NEUTROABS 6.6  --   --  6.4  HGB 12.9  13.1 12.4 12.0 13.4  HCT 39.9  38.5 36.7 36.7 42.0  MCV 92.4  88.5 89.1 90.6 94.0  PLT 187  175 175 199 299   Basic Metabolic Panel: Recent Labs  Lab 03/18/19 1435 03/20/19 0257 03/21/19 1343 03/22/19 0617 03/23/19 0526  NA 138 140 141 143 139  K 3.5 3.7 4.1 3.9 4.0  CL 101 103 103 104 99  CO2 25 27 25 27 27   GLUCOSE 160* 254* 310* 234* 202*  BUN 17 18 24* 29* 20  CREATININE 0.87 0.69 0.77 0.76 0.60  CALCIUM 8.5* 8.2* 8.8* 8.7* 9.0   GFR: Estimated Creatinine Clearance: 57.3 mL/min (by C-G formula based on SCr of 0.6 mg/dL). Liver Function Tests: Recent Labs  Lab 03/18/19 1459 03/20/19 0257 03/21/19 1343 03/22/19 0617 03/23/19 0526  AST 87* 68* 48* 40 55*  ALT 71* 61* 54* 50* 63*  ALKPHOS 28* 31* 34* 32* 38  BILITOT 0.7 0.7 0.5 0.6 0.8  PROT 7.4 7.0 7.0 7.0 7.3  ALBUMIN 3.5 3.3* 3.3* 3.3* 3.5   No results for input(s): LIPASE, AMYLASE in the last 168 hours. No results for input(s): AMMONIA in the last 168 hours. Coagulation Profile: No results for input(s): INR, PROTIME in the last 168 hours. Cardiac Enzymes: No results for input(s): CKTOTAL, CKMB, CKMBINDEX, TROPONINI in the last 168 hours. BNP (last 3 results) No results for input(s): PROBNP in the last 8760 hours. HbA1C: No results for input(s): HGBA1C in the last 72 hours. CBG: Recent Labs  Lab 03/23/19 1144 03/23/19 1701 03/23/19 2001 03/24/19 0816 03/24/19 1114  GLUCAP 168* 214* 215* 149*  272*   Lipid Profile: No results for input(s): CHOL, HDL, LDLCALC, TRIG, CHOLHDL, LDLDIRECT in the last 72 hours. Thyroid Function Tests: No results for input(s): TSH, T4TOTAL, FREET4, T3FREE, THYROIDAB in the last 72 hours. Anemia Panel: Recent Labs    03/22/19 0617  FERRITIN 353*   Urine analysis:    Component Value Date/Time   COLORURINE YELLOW (A) 03/23/2019 0045   APPEARANCEUR HAZY (A) 03/23/2019 0045   LABSPEC 1.018 03/23/2019 0045   PHURINE 7.0 03/23/2019 0045   GLUCOSEU 50 (A) 03/23/2019 0045   HGBUR NEGATIVE 03/23/2019 0045   BILIRUBINUR NEGATIVE 03/23/2019 0045   KETONESUR NEGATIVE 03/23/2019 0045   PROTEINUR NEGATIVE 03/23/2019 0045   NITRITE NEGATIVE 03/23/2019 0045   LEUKOCYTESUR NEGATIVE 03/23/2019 0045   Sepsis Labs: @LABRCNTIP (procalcitonin:4,lacticacidven:4)  ) Recent Results (from the past 240 hour(s))  SARS CORONAVIRUS 2 (TAT 6-24 HRS) Nasopharyngeal Nasopharyngeal Swab     Status: Abnormal  Collection Time: 03/19/19 11:39 AM   Specimen: Nasopharyngeal Swab  Result Value Ref Range Status   SARS Coronavirus 2 POSITIVE (A) NEGATIVE Final    Comment: RESULT CALLED TO, READ BACK BY AND VERIFIED WITH: Franchot Gallo 1834 03/19/2019 D BRADLEY (NOTE) SARS-CoV-2 target nucleic acids are DETECTED. The SARS-CoV-2 RNA is generally detectable in upper and lower respiratory specimens during the acute phase of infection. Positive results are indicative of the presence of SARS-CoV-2 RNA. Clinical correlation with patient history and other diagnostic information is  necessary to determine patient infection status. Positive results do not rule out bacterial infection or co-infection with other viruses.  The expected result is Negative. Fact Sheet for Patients: HairSlick.no Fact Sheet for Healthcare Providers: quierodirigir.com This test is not yet approved or cleared by the [place] FDA and  has been  authorized for detection and/or diagnosis of SARS-CoV-2 by FDA under an Emergency Use Authorization (EUA). This EUA will remain  in effect (meaning this test can be used) for t he duration of the COVID-19 declaration under Section 564(b)(1) of the Act, 21 U.S.C. section 360bbb-3(b)(1), unless the authorization is terminated or revoked sooner. Performed at [facility], [address], [place], [place] [address]   Urine Culture     Status: Abnormal   Collection Time: 03/20/19  5:10 AM   Specimen: Urine, Random  Result Value Ref Range Status   Specimen Description   Final    URINE, RANDOM Performed at [facility], [address] [place], [place] [address]    Special Requests   Final    NONE Performed at [facility], [address] [place], [place] [address]    Culture MULTIPLE SPECIES PRESENT, SUGGEST RECOLLECTION (A)  Final   Report Status 03/22/2019 FINAL  Final         Radiology Studies: No results found.      Scheduled Meds: . amLODipine  5 mg Oral Daily  . aspirin EC  81 mg Oral Daily  . B-complex with vitamin C  1 tablet Oral Daily  . cholecalciferol  2,000 Units Oral Daily  . clopidogrel  75 mg Oral Daily  . colchicine  0.6 mg Oral Daily  . dexamethasone (DECADRON) injection  6 mg Intravenous Daily  . enalapril  10 mg Oral Daily  . enoxaparin (LOVENOX) injection  40 mg Subcutaneous Q12H  . folic acid  0.5 mg Oral Daily  . insulin aspart  0-15 Units Subcutaneous TID WC  . insulin aspart  10 Units Subcutaneous TID WC  . insulin glargine  8 Units Subcutaneous QHS  . lacosamide  50 mg Oral BID  . magnesium oxide  200 mg Oral Daily  . rosuvastatin  5 mg Oral Daily  . vitamin C  500 mg Oral Daily   Continuous Infusions:   LOS: 6 days    Time spent: 35 minutes    Alberteen Sam, MD [facility] 03/24/2019, 3:38 PM     Please page though AMION or Epic secure chat:  For [employer], Higher education careers adviser

## 2019-03-24 NOTE — TOC Progression Note (Addendum)
Transition of Care [facility]) - Progression Note    Patient Details  Name: Carmen Olson MRN: [id number] Date of Birth: 1946/01/08  Transition of Care [facility]) CM/SW [place], LCSW Phone Number: 03/24/2019, 9:07 AM  Clinical Narrative: Left voicemail for patient's niece. Will discuss plan to return home when she calls back.    9:29 am: Received call back from niece. She confirmed plan to return home and is requesting a hoyer lift. Asked MD for the DME order and notified [place] representative. Patient can discharge once it is delivered. Plan is still for her to go by EMS.  9:58 am: Received call from niece requesting home health PT through [facility] since that is the agency her sister works with. They are able to accept her and start services once she has completed her COVID quarantine. Asked MD for HHPT order.  1:24 pm: Received call from niece. Plan is now likely for SNF. Discussed bed available for SNF's on the hub. Next preference is for [facility] in [place] (Patient has to be 10 days with symptoms and 3 days without) or [facility] in [place] (no COVID beds available).  2:21 pm: [facility] will have a bed available tomorrow. Patient's nephew and niece are aware and agreeable. MD is aware. CSW coworker will fax clinicals to [facility] for insurance authorization review.  3:22 pm: CSW confirmed [facility] received the clinicals that were faxed over.  Expected Discharge Plan: [place] [facility](CAPs program) Barriers to Discharge: Barriers Resolved  Expected Discharge Plan and Services Expected Discharge Plan: [place] [facility](CAPs program)     Post Acute Care Choice: Resumption of Svcs/PTA Provider Living arrangements for the past 2 months: Single Family Home Expected Discharge Date: 03/22/19                                     Social Determinants of Health (SDOH) Interventions    Readmission Risk  Interventions No flowsheet data found.

## 2019-03-25 LAB — GLUCOSE, CAPILLARY
Glucose-Capillary: 170 mg/dL — ABNORMAL HIGH (ref 70–99)
Glucose-Capillary: 381 mg/dL — ABNORMAL HIGH (ref 70–99)
Glucose-Capillary: 127 mg/dL — ABNORMAL HIGH (ref 70–99)

## 2019-03-25 LAB — AMMONIA: Ammonia: 24 umol/L (ref 9–35)

## 2019-03-25 NOTE — Care Management Important Message (Signed)
Important Message  Patient Details  Name: Carmen Olson MRN: [id number] Date of Birth: [birth year]   Medicare Important Message Given:  Yes - Important Message mailed due to current National Emergency  Reviewed verbally with Mervin Kung, niece.  Requested copy of Medicare IM be mailed to her attention at:  [name], [name] [address]   [name] 03/25/2019, 9:56 AM

## 2019-03-25 NOTE — TOC Transition Note (Signed)
Transition of Care [facility]) - CM/SW Discharge Note   Patient Details  Name: Carmen Olson MRN: [id number] Date of Birth: 26-Aug-1945  Transition of Care [facility]) CM/SW Contact:  Carmen Chroman, LCSW Phone Number: 03/25/2019, 2:57 PM   Clinical Narrative: Patient has orders to discharge home today. DME will be delivered around 5:00. Asked RN to set up EMS transport around 5:30. Niece is aware. No further concerns. CSW signing off.    Final next level of care: [place] Barriers to Discharge: Barriers Resolved   Patient Goals and CMS Choice Patient states their goals for this hospitalization and ongoing recovery are:: Patient not fully oriented.   Choice offered to / list presented to : NA  Discharge Placement                Patient to be transferred to facility by: EMS Name of family member notified: Carmen Olson Patient and family notified of of transfer: 03/25/19  Discharge Plan and [place] Choice: Resumption of Svcs/PTA Provider          DME Arranged: Hospital bed, Other see comment(Hoyer lift) DME Agency: [employer] Date DME Agency Contacted: 03/25/19   Representative spoke with at DME Agency: [place]: PT [place]: [facility] Date [place]: 03/25/19   Representative spoke with at [place]: [place] Interventions     Readmission Risk Interventions No flowsheet data found.

## 2019-03-25 NOTE — Clinical Social Work Note (Addendum)
Patient has a diagnosis of an old CVA with hemiparesis and a pressure injury on her sacrum which requires her body to be positioned in ways not feasible with a normal bed. Head must be elevated at least 30 degrees or more or chronic pain will occur.  Dayton Scrape, [place]

## 2019-03-25 NOTE — Discharge Summary (Signed)
Physician Discharge Summary  Carmen Olson WGN:[id number] DOB: Sep 07, 1945 DOA: 03/18/2019  PCP: Leslye Peer, PA  Admit date: 03/18/2019 Discharge date: 03/25/2019  Admitted From: Home  Disposition:  Home with [facility]   Recommendations for Outpatient Follow-up:  1. Follow up with Dr. Tania Ade by telemedicine as soon as able 2. Please obtain LFTs in 4-6 weeks to document resoluation of transaminitis     Home Health: PT/OT/RN/aide due to new weakness, making it difficult for patient to walk and stand   Equipment/Devices: Nurse, adult, patient too weak to ambualte and will require lift for home use  Discharge Condition: Poor  CODE STATUS: FULL Diet recommendation: Diabetic, cardiac  Brief/Interim Summary: Mrs. Haycraft is a 73 y.o. F with hx CVA with left hemiparesis, DM, HTN lives at home, walks with cane, no previous dementia, HTN, DM and compensated cirrhosis who presented with SOB, fever for 1 week.  In the ER, CXR with multifocal pneumonia.  COVID+.  SpO2 88% on room air.     PRINCIPAL HOSPITAL DIAGNOSIS: COVID-19       Discharge Diagnoses:   Coronavirus pneumonitis with acute hypoxic respiratory failure Patient admitted with hypoxia multifocal pneumonia in the setting of COVID-19 pandemic.  She was started on remdesivir and steroids and her inflammatory markers improved on these treatments.  She completed 5 days of remdesivir and 8 days of steroids, and she was weaned completely off oxygen for more than 48 hours.   Acute metabolic encephalopathy due to Covid Patient had onset of generalized confusion and decreased sensorium in the context of Covid.  CT of the head showed a chronic right MCA infarct, chronic microvascular ischemia, but no new findings.  Ammonia level is normal. No medications were contributing.  Still with poor oral intake.  Dietitian consulted and nutrition maximized.  The patient was persistently more confused and weak than baseline and was  recommended to go to SNF for rehab after her in hospital treatments for COVID pneumonia were complete, but family preferred to take her home with maximal in home supports.  Cerebrovascular disease secondary prevention Hypertension  Diabetes Glucoses elevated, and so patient discharged on new Lantus.   Stage II pressure injury sacrum, POA   Gout  Cirrhosis No history HE or ascites.  Appears completely compensated here.  LFTs stable on remdesivir.           Discharge Instructions  Discharge Instructions    MyChart COVID-19 home monitoring program   Complete by: Mar 24, 2019    Is the patient willing to use the MyChart Mobile App for home monitoring?: Yes   Temperature monitoring   Complete by: Mar 24, 2019    After how many days would you like to receive a notification of this patient's flowsheet entries?: 1   Bed rest   Complete by: As directed    Per PT   Call MD for:  difficulty breathing, headache or visual disturbances   Complete by: As directed    Call MD for:  extreme fatigue   Complete by: As directed    Call MD for:  persistant dizziness or light-headedness   Complete by: As directed    Call MD for:  temperature >100.4   Complete by: As directed    Diet - low sodium heart healthy   Complete by: As directed    Discharge instructions   Complete by: As directed    You were admitted for coronavirus (Also known as COVID-19)  You were treated with an anti-virus medicine ("  remdesivir") and an anti-inflammatory (a "steroid") while you were here.  You completed the course of the anti-virus medicine, remdesivir  You should finish the course of steroids by taking dexamethasone 6 mg once daily in the morning for 2 more days  If you have any lingering cough, you should take the cough syrup we gave you here, Robitussin (with the ingredients "GUIAFENESIN" and "DEXTROMETHORPHAN")  You should purchase a pulse oximeter at your pharmacy. This is a device that you  put on your finger to measure your oxygen level.  They are available at any pharmacy. Use it to check your oxygen level twice daily until you see your primary care doctor. If your oxygen level is ever LESS than 88% and doesn't get better, you should call your primary care doctor immediately.   HOW LONG TO REMAIN IN QUARANTINE: There is no absolutely correct answer to this and so our best answer is to be on the cautious side.  Based on what we know of the virus, you should isolate strictly until 21 days from your first symptoms.  Until you end your quarantine: If you have anyone in the home who has NOT had coronavirus:    -do not be in the same room with them until your self isolation is over    -wear a mask and have them wear a mask if you MUST be in the same room    -clean all hard surfaces (counters, doors, tables) twice a day    -use a separate bathroom at all times    Resume all your other home medicines Schedule a telemedicine appointment with your primary care doctor as soon as possible  For your diabetes, resume your home diabetes medicine (glipizide metformin) but also add Lantus 10 units each morning If your morning sugar is LESS than 120, call your primary care doctor before taking the Lantus (to decide if you should take this long term)   For home use only DME Other see comment   Complete by: As directed    Hoyer lift   Length of Need: 6 Months     Allergies as of 03/25/2019      Reactions   Sulfa Antibiotics Itching      Medication List    TAKE these medications   amLODipine 5 MG tablet Commonly known as: NORVASC Take 5 mg by mouth daily.   ascorbic acid 500 MG tablet Commonly known as: VITAMIN C Take 500 mg by mouth daily.   aspirin 81 MG EC tablet Take 81 mg by mouth daily.   B-complex with vitamin C tablet Take 1 tablet by mouth daily.   Cholecalciferol 50 MCG (2000 UT) Tabs Take 2,000 Units by mouth daily.   Cinnamon 500 MG capsule Take 500 mg  by mouth 2 (two) times daily.   clopidogrel 75 MG tablet Commonly known as: PLAVIX Take 75 mg by mouth daily.   colchicine 0.6 MG tablet Take 0.6 mg by mouth daily.   dexamethasone 6 MG tablet Commonly known as: DECADRON Take 1 tablet (6 mg total) by mouth daily.   diphenhydrAMINE 50 MG tablet Commonly known as: BENADRYL Take 50 mg by mouth at bedtime.   enalapril 10 MG tablet Commonly known as: VASOTEC Take 10 mg by mouth daily.   Fish Oil 1000 MG Caps Take 1,000 mg by mouth daily.   Flax Seed Oil 1000 MG Caps Take 1,000 mg by mouth daily.   folic acid 767 MCG tablet Commonly known as: FOLVITE Take 400  mcg by mouth daily.   glipiZIDE-metformin 2.5-500 MG tablet Commonly known as: METAGLIP Take 2 tablets by mouth 2 (two) times daily.   insulin glargine 100 UNIT/ML injection Commonly known as: LANTUS Inject 0.1 mLs (10 Units total) into the skin at bedtime.   Magnesium 200 MG Tabs Take 200 mg by mouth daily.   QC Tumeric Complex 500 MG Caps Generic drug: Turmeric Take 500 mg by mouth daily.   rosuvastatin 5 MG tablet Commonly known as: CRESTOR Take 5 mg by mouth daily.   Vimpat 50 MG Tabs tablet Generic drug: lacosamide Take 50 mg by mouth 2 (two) times daily.            Durable Medical Equipment  (From admission, onward)         Start     Ordered   03/24/19 0000  For home use only DME Other see comment    Comments: Michiel Sites lift  Question:  Length of Need  Answer:  6 Months   03/24/19 0933          Contact information for follow-up providers    Leslye Peer, PA. Schedule an appointment as soon as possible for a visit in 1 week(s).   Specialty: Family Medicine Why: Follow up by phone/telemedicine Contact information: [address] [facility] [place] [address] [phone]            Contact information for after-discharge care    Destination    [facility] Preferred SNF .   Service: Skilled  Nursing Contact information: [address] [place] [address] [phone]                 Allergies  Allergen Reactions  . Sulfa Antibiotics Itching    Consultations:     Procedures/Studies: DG Chest 2 View  Result Date: 03/18/2019 CLINICAL DATA:  Shortness of breath, COVID-19 positive EXAM: CHEST - 2 VIEW COMPARISON:  None. FINDINGS: Airspace opacity present in the left mid to lower lung as well as more pain streaky opacity in the right infrahilar lung. No pneumothorax or effusion. The cardiomediastinal contours are unremarkable. No acute osseous or soft tissue abnormality. Degenerative changes are present in the imaged spine and shoulders. IMPRESSION: Features worrisome for multifocal pneumonia, particularly in the setting of COVID-19 positivity. Electronically Signed   By: Kreg Shropshire M.D.   On: 03/18/2019 15:11   CT HEAD WO CONTRAST  Result Date: 03/19/2019 CLINICAL DATA:  Altered mental status.  COVID-19 positive EXAM: CT HEAD WITHOUT CONTRAST TECHNIQUE: Contiguous axial images were obtained from the base of the skull through the vertex without intravenous contrast. COMPARISON:  None. FINDINGS: Brain: Large territory chronic right MCA infarct. Chronic infarct left internal capsule. Most white matter likely chronic ischemia hypodensity in the frontal lobes bilaterally. Generalized atrophy without hydrocephalus. Negative for acute infarct, hemorrhage, or mass. Vascular: Negative for hyperdense vessel Skull: Negative Sinuses/Orbits: Negative Other: None IMPRESSION: No acute abnormality. Chronic right MCA infarct. Chronic microvascular ischemia. Electronically Signed   By: Marlan Palau M.D.   On: 03/19/2019 18:58       Subjective: Patient still confused.  No fever.  No respiratory distress, no hypoxia.    Discharge Exam: Vitals:   03/25/19 0419 03/25/19 0845  BP: (!) 133/98 (!) 147/91  Pulse: 99 81  Resp: 18 18  Temp: 97.6 F (36.4 C) 98 F (36.7  C)  SpO2: 100% 99%   Vitals:   03/24/19 1537 03/24/19 2052 03/25/19 0419 03/25/19  0845  BP: 109/90 (!) 149/77 (!) 133/98 (!) 147/91  Pulse: 81 74 99 81  Resp: Temp: 97.7 F (36.5 C) 97.7 F (36.5 C) 97.6 F (36.4 C) 98 F (36.7 C)  TempSrc: Oral Oral Oral   SpO2: 98% 100% 100% 99%  Weight:      Height:        General: Pt is alert to me, interactive, awake, not in acute distress, appears somewhat confused. Cardiovascular: RRR, nl S1-S2, no murmurs appreciated.   No LE edema.   Respiratory: Normal respiratory rate and rhythm.  CTAB without rales or wheezes. Abdominal: Abdomen soft and non-tender.  No distension or HSM.   Neuro/Psych: Strength symmetric in upper and lower extremities.  Judgment and insight appear impaired.  Able to state her name, but not where she is or why.  Slowly follows commands.   The results of significant diagnostics from this hospitalization (including imaging, microbiology, ancillary and laboratory) are listed below for reference.     Microbiology: Recent Results (from the past 240 hour(s))  SARS CORONAVIRUS 2 (TAT 6-24 HRS) Nasopharyngeal Nasopharyngeal Swab     Status: Abnormal   Collection Time: 03/19/19 11:39 AM   Specimen: Nasopharyngeal Swab  Result Value Ref Range Status   SARS Coronavirus 2 POSITIVE (A) NEGATIVE Final    Comment: RESULT CALLED TO, READ BACK BY AND VERIFIED WITH: Franchot Gallo 1834 03/19/2019 D BRADLEY (NOTE) SARS-CoV-2 target nucleic acids are DETECTED. The SARS-CoV-2 RNA is generally detectable in upper and lower respiratory specimens during the acute phase of infection. Positive results are indicative of the presence of SARS-CoV-2 RNA. Clinical correlation with patient history and other diagnostic information is  necessary to determine patient infection status. Positive results do not rule out bacterial infection or co-infection with other viruses.  The expected result is Negative. Fact Sheet for  Patients: HairSlick.no Fact Sheet for Healthcare Providers: quierodirigir.com This test is not yet approved or cleared by the [place] FDA and  has been authorized for detection and/or diagnosis of SARS-CoV-2 by FDA under an Emergency Use Authorization (EUA). This EUA will remain  in effect (meaning this test can be used) for t he duration of the COVID-19 declaration under Section 564(b)(1) of the Act, 21 U.S.C. section 360bbb-3(b)(1), unless the authorization is terminated or revoked sooner. Performed at [facility], [address] [place], [place] [address]   Urine Culture     Status: Abnormal   Collection Time: 03/20/19  5:10 AM   Specimen: Urine, Random  Result Value Ref Range Status   Specimen Description   Final    URINE, RANDOM Performed at [facility], [address], [place], [place] [address]    Special Requests   Final    NONE Performed at [facility], [address] [place], [place] [address]    Culture MULTIPLE SPECIES PRESENT, SUGGEST RECOLLECTION (A)  Final   Report Status 03/22/2019 FINAL  Final     Labs: BNP (last 3 results) No results for input(s): BNP in the last 8760 hours. Basic Metabolic Panel: Recent Labs  Lab 03/18/19 1435 03/20/19 0257 03/21/19 1343 03/22/19 0617 03/23/19 0526  NA 138 140 141 143 139  K 3.5 3.7 4.1 3.9 4.0  CL 101 103 103 104 99  CO2 GLUCOSE 160* 254* 310* 234* 202*  BUN 17 18 24* 29* 20  CREATININE 0.87 0.69 0.77 0.76 0.60  CALCIUM 8.5* 8.2* 8.8*  8.7* 9.0   Liver Function Tests: Recent Labs  Lab 03/18/19 1459 03/20/19 0257 03/21/19 1343 03/22/19 0617 03/23/19 0526  AST 87* 68* 48* 40 55*  ALT 71* 61* 54* 50* 63*  ALKPHOS 28* 31* 34* 32* 38  BILITOT 0.7 0.7 0.5 0.6 0.8  PROT 7.4 7.0 7.0 7.0 7.3  ALBUMIN 3.5 3.3* 3.3* 3.3* 3.5   No results for input(s): LIPASE, AMYLASE in the last 168 hours. Recent Labs   Lab 03/25/19 1016  AMMONIA 24   CBC: Recent Labs  Lab 03/18/19 1435 03/19/19 0936 03/20/19 0257 03/22/19 0618  WBC 11.5*  11.6* 10.4 7.5 10.8*  NEUTROABS 6.6  --   --  6.4  HGB 12.9  13.1 12.4 12.0 13.4  HCT 39.9  38.5 36.7 36.7 42.0  MCV 92.4  88.5 89.1 90.6 94.0  PLT 187  175 175 199 299   Cardiac Enzymes: No results for input(s): CKTOTAL, CKMB, CKMBINDEX, TROPONINI in the last 168 hours. BNP: Invalid input(s): POCBNP CBG: Recent Labs  Lab 03/24/19 0816 03/24/19 1114 03/24/19 1608 03/24/19 2046 03/25/19 0841  GLUCAP 149* 272* 194* 217* 170*   D-Dimer No results for input(s): DDIMER in the last 72 hours. Hgb A1c No results for input(s): HGBA1C in the last 72 hours. Lipid Profile No results for input(s): CHOL, HDL, LDLCALC, TRIG, CHOLHDL, LDLDIRECT in the last 72 hours. Thyroid function studies No results for input(s): TSH, T4TOTAL, T3FREE, THYROIDAB in the last 72 hours.  Invalid input(s): FREET3 Anemia work up No results for input(s): VITAMINB12, FOLATE, FERRITIN, TIBC, IRON, RETICCTPCT in the last 72 hours. Urinalysis    Component Value Date/Time   COLORURINE YELLOW (A) 03/23/2019 0045   APPEARANCEUR HAZY (A) 03/23/2019 0045   LABSPEC 1.018 03/23/2019 0045   PHURINE 7.0 03/23/2019 0045   GLUCOSEU 50 (A) 03/23/2019 0045   HGBUR NEGATIVE 03/23/2019 0045   BILIRUBINUR NEGATIVE 03/23/2019 0045   KETONESUR NEGATIVE 03/23/2019 0045   PROTEINUR NEGATIVE 03/23/2019 0045   NITRITE NEGATIVE 03/23/2019 0045   LEUKOCYTESUR NEGATIVE 03/23/2019 0045   Sepsis Labs Invalid input(s): PROCALCITONIN,  WBC,  LACTICIDVEN Microbiology Recent Results (from the past 240 hour(s))  SARS CORONAVIRUS 2 (TAT 6-24 HRS) Nasopharyngeal Nasopharyngeal Swab     Status: Abnormal   Collection Time: 03/19/19 11:39 AM   Specimen: Nasopharyngeal Swab  Result Value Ref Range Status   SARS Coronavirus 2 POSITIVE (A) NEGATIVE Final    Comment: RESULT CALLED TO, READ BACK BY AND  VERIFIED WITH: Franchot Gallo 1834 03/19/2019 D [place] (NOTE) SARS-CoV-2 target nucleic acids are DETECTED. The SARS-CoV-2 RNA is generally detectable in upper and lower respiratory specimens during the acute phase of infection. Positive results are indicative of the presence of SARS-CoV-2 RNA. Clinical correlation with patient history and other diagnostic information is  necessary to determine patient infection status. Positive results do not rule out bacterial infection or co-infection with other viruses.  The expected result is Negative. Fact Sheet for Patients: HairSlick.no Fact Sheet for Healthcare Providers: quierodirigir.com This test is not yet approved or cleared by the [place] FDA and  has been authorized for detection and/or diagnosis of SARS-CoV-2 by FDA under an Emergency Use Authorization (EUA). This EUA will remain  in effect (meaning this test can be used) for t he duration of the COVID-19 declaration under Section 564(b)(1) of the Act, 21 U.S.C. section 360bbb-3(b)(1), unless the authorization is terminated or revoked sooner. Performed at [facility], [address], [place], [place] [address]  Urine Culture     Status: Abnormal   Collection Time: 03/20/19  5:10 AM   Specimen: Urine, Random  Result Value Ref Range Status   Specimen Description   Final    URINE, RANDOM Performed at [facility], [address] [place], [place] [address]    Special Requests   Final    NONE Performed at [facility], [address] [place], [place] [address]    Culture MULTIPLE SPECIES PRESENT, SUGGEST RECOLLECTION (A)  Final   Report Status 03/22/2019 FINAL  Final     Time coordinating discharge: 35 minutes      SIGNED:   Alberteen Samhristopher P Yeimy Brabant, MD  [facility] 03/25/2019, 11:18 AM

## 2019-03-25 NOTE — Clinical Social Work Note (Signed)
Patient too weak to ambulate and will require hoyer lift for home use.   Carmen Olson, [place]

## 2019-03-25 NOTE — TOC Progression Note (Addendum)
Transition of Care [facility]) - Progression Note    Patient Details  Name: Carmen Olson MRN: [id number] Date of Birth: March 21, 1946  Transition of Care [facility]) CM/SW [place], LCSW Phone Number: 03/25/2019, 8:05 AM  Clinical Narrative:  Received call from niece. Due to concerns over SNF scores, they want to bring her home instead. [employer] will re-order hoyer lift. May not be delivered until later this evening. Asked they call CSW with a time frame once obtained. Left message for [facility] representative to update her on plan.   1:20 pm: Per [employer], niece is requesting a hospital bed. DME order is in as well as note required for insurance to cover.   Expected Discharge Plan: [place] [facility](CAPs program) Barriers to Discharge: Barriers Resolved  Expected Discharge Plan and Services Expected Discharge Plan: [place] [facility](CAPs program)     Post Acute Care Choice: Resumption of Svcs/PTA Provider Living arrangements for the past 2 months: Single Family Home Expected Discharge Date: 03/22/19                                     Social Determinants of Health (SDOH) Interventions    Readmission Risk Interventions No flowsheet data found.

## 2019-03-25 NOTE — Progress Notes (Signed)
Discharge instructions explained to pts niece, Lashone Stauber / verbalized an understanding/ iv and tele removed/ EMS to transport home

## 2019-04-09 DEATH — deceased

## 2021-12-09 IMAGING — CT CT HEAD W/O CM
3 series · 16 of 46 positions shown, 19 images · non-contrast
Comparison: None.

CLINICAL DATA: Altered mental status.  [id number] positive

EXAM:
CT HEAD WITHOUT CONTRAST
TECHNIQUE: Contiguous axial images were obtained from the base of the skull
through the vertex without intravenous contrast.

[Series 2: head wo · axial · 0.40mm/px · z∈[-163,-43]mm · 10 of 29 slices shown, 13 images]
[im 3/29  brain]
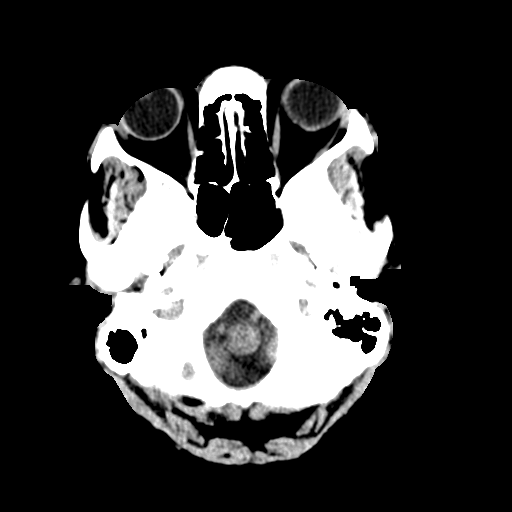
[im 3/29  bone]
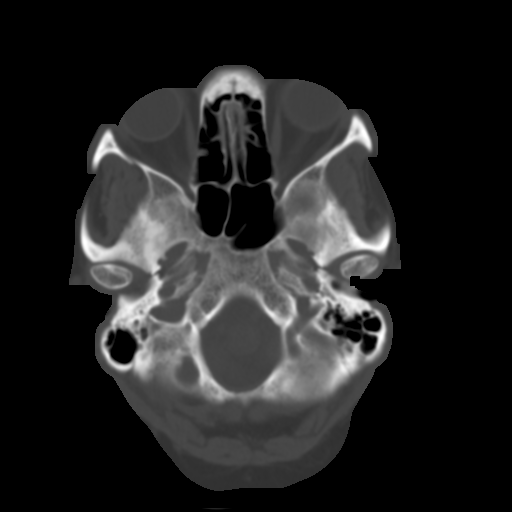
[im 6/29  brain]
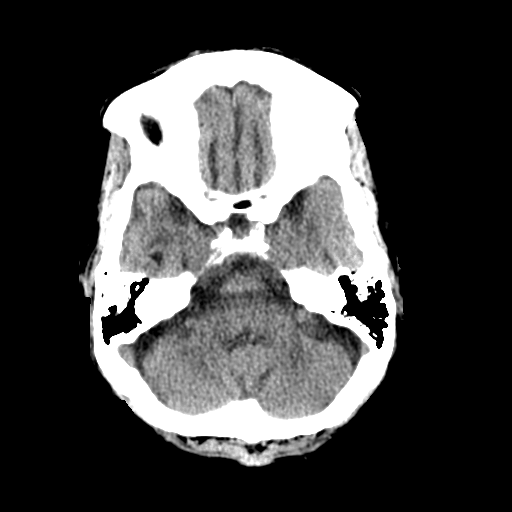
[im 8/29  brain]
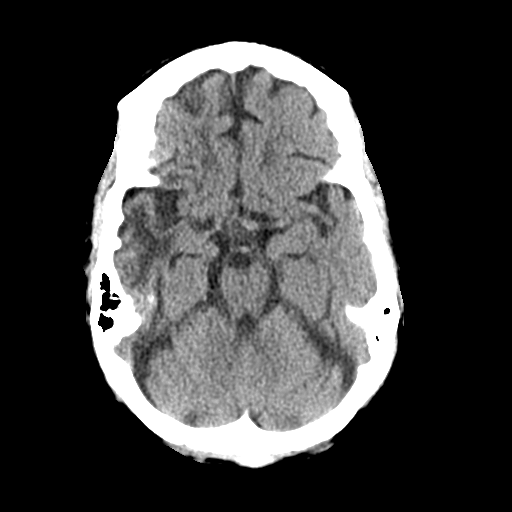
[im 11/29  brain]
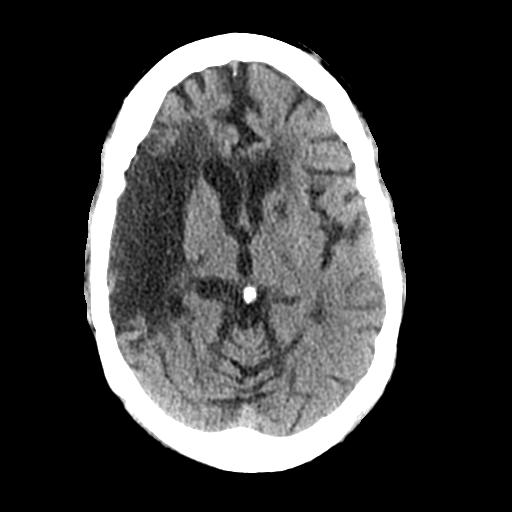
[im 14/29  brain]
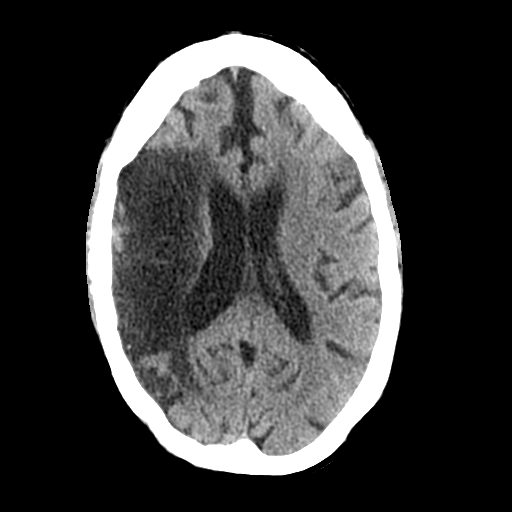
[im 14/29  bone]
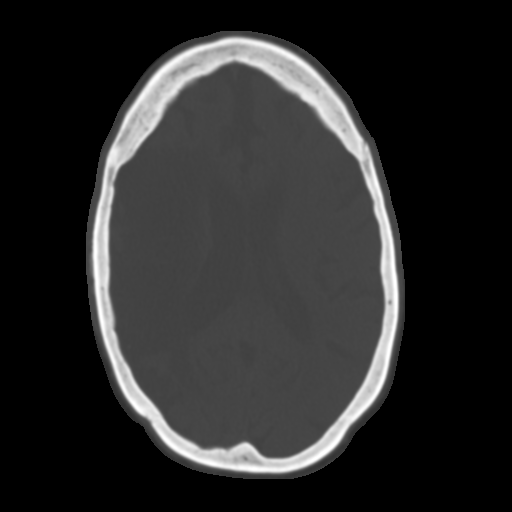
[im 16/29  brain]
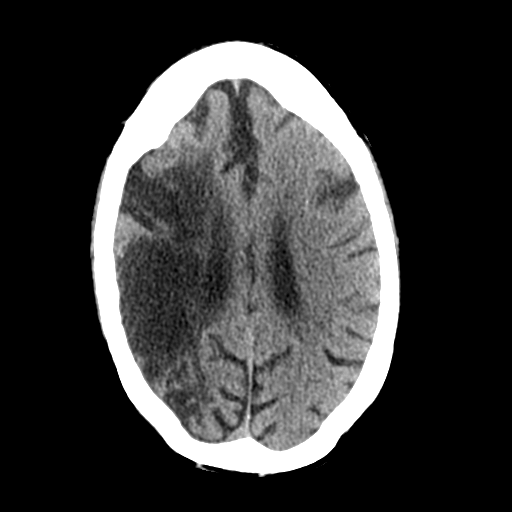
[im 19/29  brain]
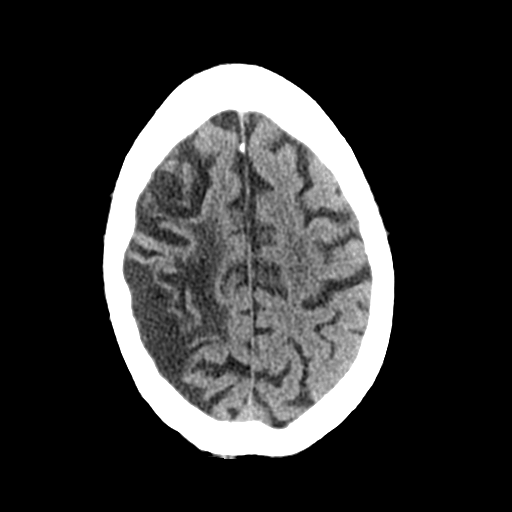
[im 22/29  brain]
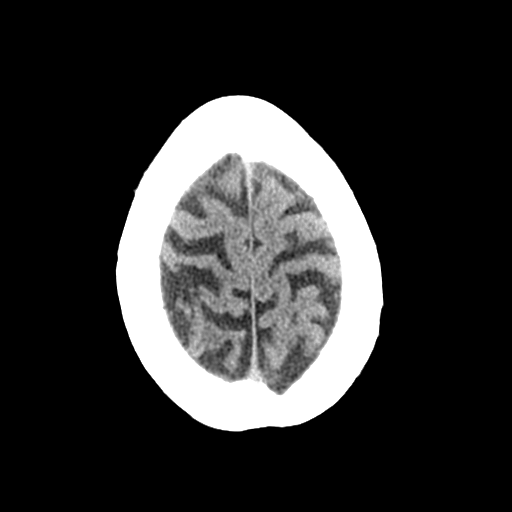
[im 24/29  brain]
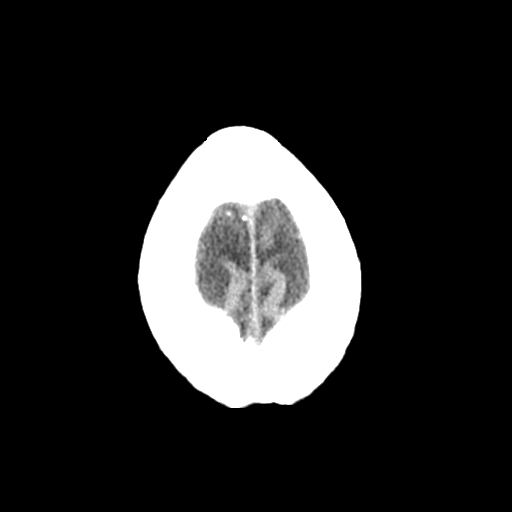
[im 24/29  bone]
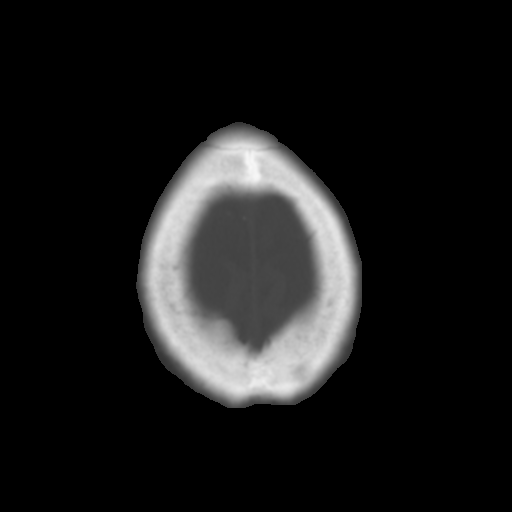
[im 27/29  brain]
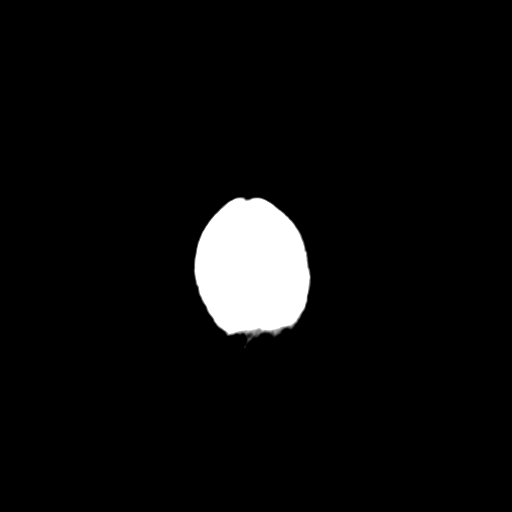

[Series 4: coronal soft tissue · coronal · 0.32mm/px · 3 of 64 slices shown]
[im 22/64  brain]
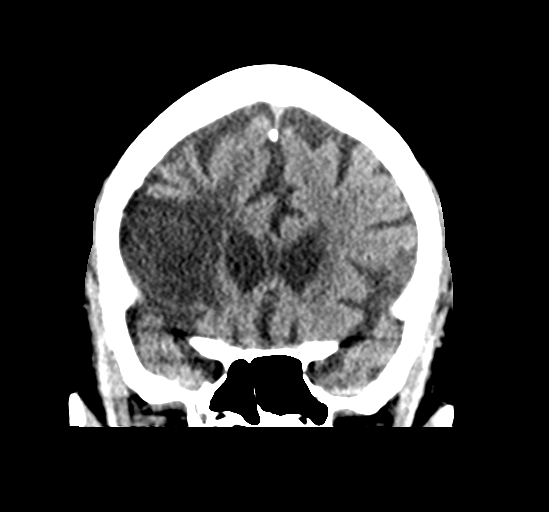
[im 29/64  brain]
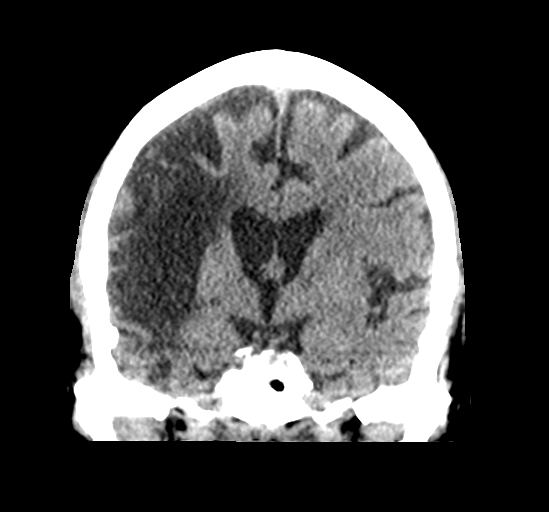
[im 36/64  brain]
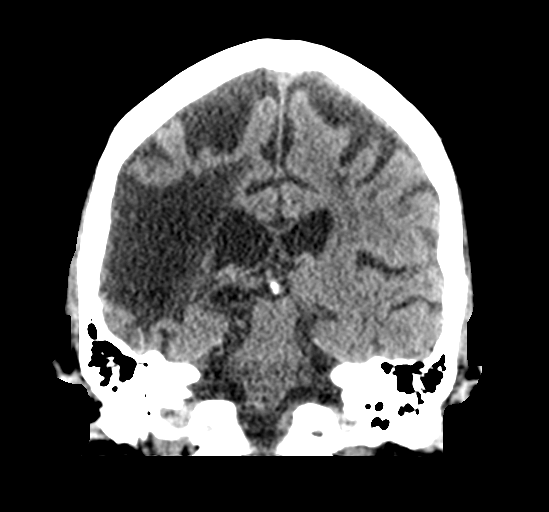

[Series 5: sagittal soft tissue · sagittal · 0.32mm/px · 3 of 51 slices shown]
[im 17/51  brain]
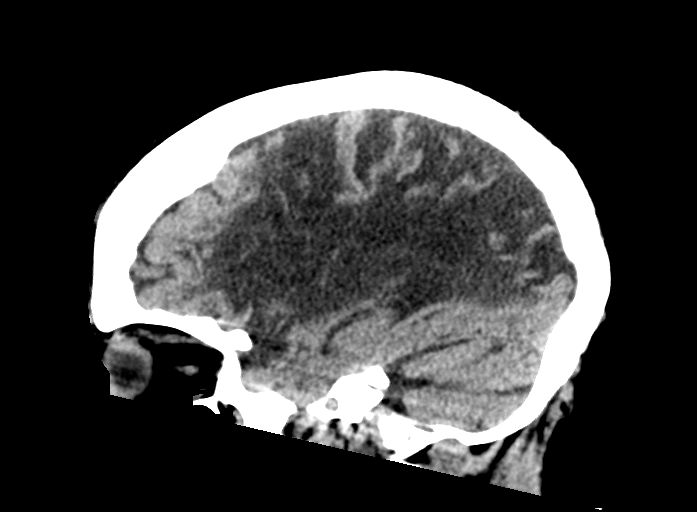
[im 26/51  brain]
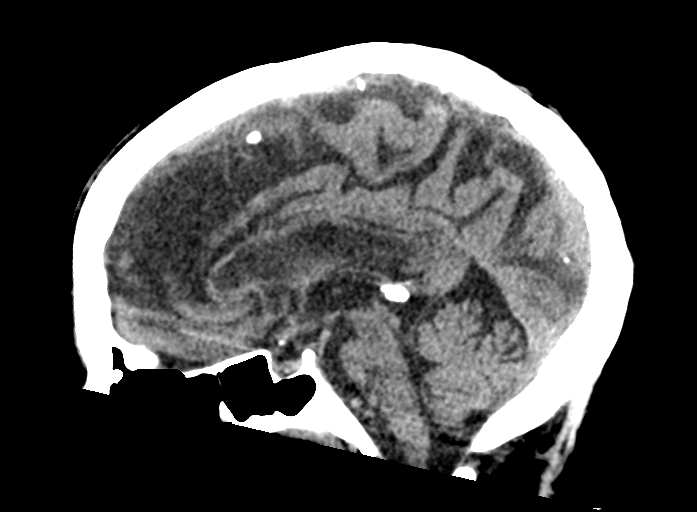
[im 34/51  brain]
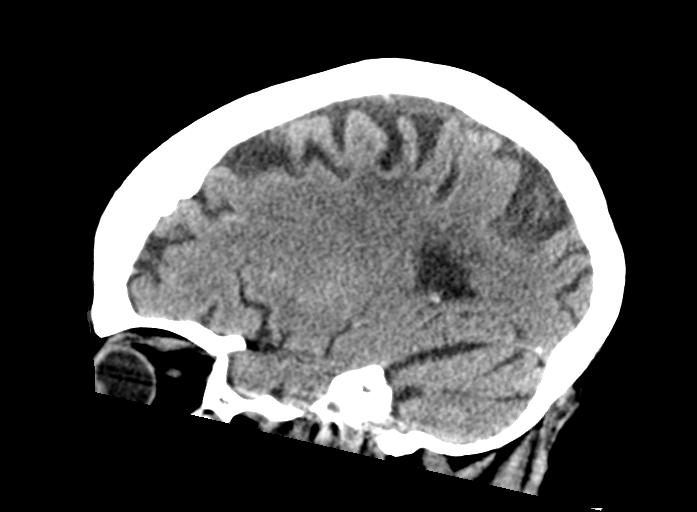

[16 of 46 positions shown; findings below may reference images not displayed]

FINDINGS: Brain: Large territory chronic right MCA infarct. Chronic infarct
left internal capsule. Most white matter likely chronic ischemia
hypodensity in the frontal lobes bilaterally. Generalized atrophy
without hydrocephalus.

Negative for acute infarct, hemorrhage, or mass.

Vascular: Negative for hyperdense vessel

Skull: Negative

Sinuses/Orbits: Negative

Other: None
IMPRESSION: No acute abnormality. Chronic right MCA infarct. Chronic
microvascular ischemia.

## 2024-01-09 ENCOUNTER — Encounter: Payer: Self-pay | Admitting: Psychology

## 2024-01-09 NOTE — Progress Notes (Deleted)
? ? ? ? ? ? ? ? ? ? ? ? ? ? ?  Carmen Olson Carmen [place], PsyD ?

## 2024-01-12 NOTE — Progress Notes (Deleted)
 Error

## 2024-01-16 NOTE — Progress Notes (Deleted)
 Created in error.                Carmen ONEIDA Fire, PsyD

## 2024-01-16 NOTE — Progress Notes (Signed)
 This encounter was created in error.                Frederic ONEIDA Fire, PsyD
